# Patient Record
Sex: Female | Born: 1935
Health system: Southern US, Community
[De-identification: ages and names within clinical notes are randomized; demographics above are authoritative.]

## PROBLEM LIST (undated history)

## (undated) DIAGNOSIS — I10 Essential (primary) hypertension: Secondary | ICD-10-CM

## (undated) DIAGNOSIS — T7840XA Allergy, unspecified, initial encounter: Secondary | ICD-10-CM

## (undated) DIAGNOSIS — K219 Gastro-esophageal reflux disease without esophagitis: Secondary | ICD-10-CM

## (undated) DIAGNOSIS — E785 Hyperlipidemia, unspecified: Secondary | ICD-10-CM

## (undated) DIAGNOSIS — K5790 Diverticulosis of intestine, part unspecified, without perforation or abscess without bleeding: Secondary | ICD-10-CM

## (undated) DIAGNOSIS — I701 Atherosclerosis of renal artery: Secondary | ICD-10-CM

## (undated) DIAGNOSIS — M1990 Unspecified osteoarthritis, unspecified site: Secondary | ICD-10-CM

## (undated) DIAGNOSIS — K589 Irritable bowel syndrome without diarrhea: Secondary | ICD-10-CM

## (undated) DIAGNOSIS — K449 Diaphragmatic hernia without obstruction or gangrene: Secondary | ICD-10-CM

## (undated) DIAGNOSIS — I2510 Atherosclerotic heart disease of native coronary artery without angina pectoris: Secondary | ICD-10-CM

## (undated) DIAGNOSIS — I6529 Occlusion and stenosis of unspecified carotid artery: Secondary | ICD-10-CM

## (undated) DIAGNOSIS — F419 Anxiety disorder, unspecified: Secondary | ICD-10-CM

## (undated) HISTORY — DX: Irritable bowel syndrome, unspecified: K58.9

## (undated) HISTORY — DX: Occlusion and stenosis of unspecified carotid artery: I65.29

## (undated) HISTORY — PX: RECTAL PROLAPSE REPAIR: SHX759

## (undated) HISTORY — DX: Atherosclerosis of renal artery: I70.1

## (undated) HISTORY — DX: Atherosclerotic heart disease of native coronary artery without angina pectoris: I25.10

## (undated) HISTORY — DX: Allergy, unspecified, initial encounter: T78.40XA

## (undated) HISTORY — PX: EYE SURGERY: SHX253

## (undated) HISTORY — PX: BREAST SURGERY: SHX581

## (undated) HISTORY — DX: Hyperlipidemia, unspecified: E78.5

## (undated) HISTORY — PX: ABDOMINAL HYSTERECTOMY: SHX81

## (undated) HISTORY — DX: Essential (primary) hypertension: I10

## (undated) HISTORY — DX: Diaphragmatic hernia without obstruction or gangrene: K44.9

## (undated) HISTORY — DX: Diverticulosis of intestine, part unspecified, without perforation or abscess without bleeding: K57.90

---

## 2001-04-30 ENCOUNTER — Encounter: Payer: Self-pay | Admitting: Neurology

## 2001-04-30 ENCOUNTER — Ambulatory Visit (HOSPITAL_COMMUNITY): Admission: RE | Admit: 2001-04-30 | Discharge: 2001-04-30 | Payer: Self-pay | Admitting: Neurology

## 2001-05-07 ENCOUNTER — Ambulatory Visit (HOSPITAL_COMMUNITY): Admission: RE | Admit: 2001-05-07 | Discharge: 2001-05-07 | Payer: Self-pay | Admitting: Neurology

## 2006-02-06 ENCOUNTER — Encounter: Admission: RE | Admit: 2006-02-06 | Discharge: 2006-02-06 | Payer: Self-pay | Admitting: Internal Medicine

## 2008-06-03 ENCOUNTER — Encounter: Admission: RE | Admit: 2008-06-03 | Discharge: 2008-06-03 | Payer: Self-pay | Admitting: Gastroenterology

## 2008-07-26 ENCOUNTER — Inpatient Hospital Stay (HOSPITAL_COMMUNITY): Admission: EM | Admit: 2008-07-26 | Discharge: 2008-07-27 | Payer: Self-pay | Admitting: Emergency Medicine

## 2008-07-26 ENCOUNTER — Ambulatory Visit: Payer: Self-pay | Admitting: Cardiology

## 2008-07-27 ENCOUNTER — Encounter (INDEPENDENT_AMBULATORY_CARE_PROVIDER_SITE_OTHER): Payer: Self-pay | Admitting: Internal Medicine

## 2009-08-16 IMAGING — CT CT CERVICAL SPINE W/O CM
2 of 8 series · 4 of 27 positions shown, 5 images · non-contrast
Comparison: None

CT CERVICAL SPINE

CLINICAL DATA: Fall with neck pain and chest pain.

CT CERVICAL SPINE WITHOUT CONTRAST
CT CHEST WITHOUT CONTRAST
TECHNIQUE: Mulidetector CT imaging of the cervical spine and the
chest were performed without contrast. Multiplanar CT image
reconstructions were also generated.

[Series 6: routine chest · axial · 0.62mm/px · z∈[-582,-238]mm · 3 of 70 slices shown, 4 images]
[im 1/70  soft-tissue]
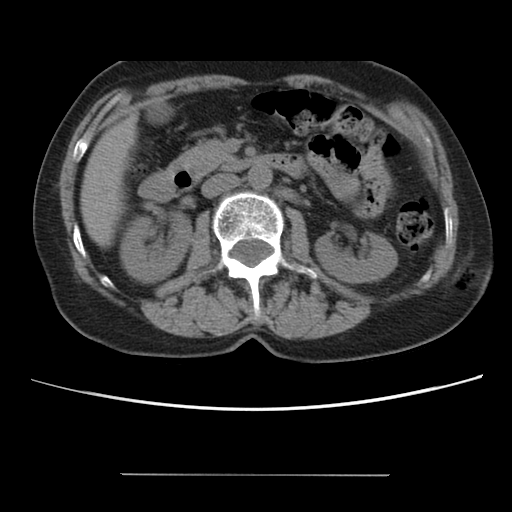
[im 1/70  bone]
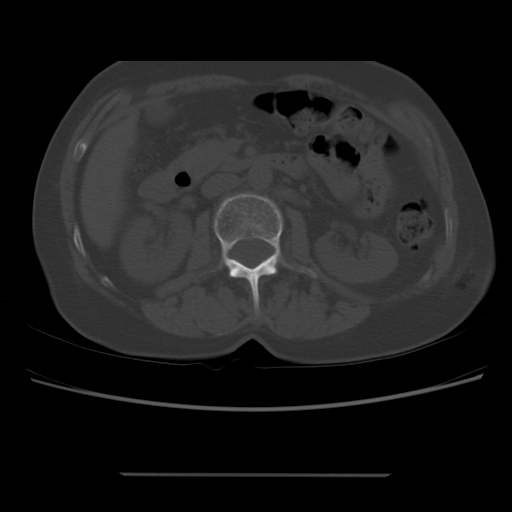
[im 35/70  bone]
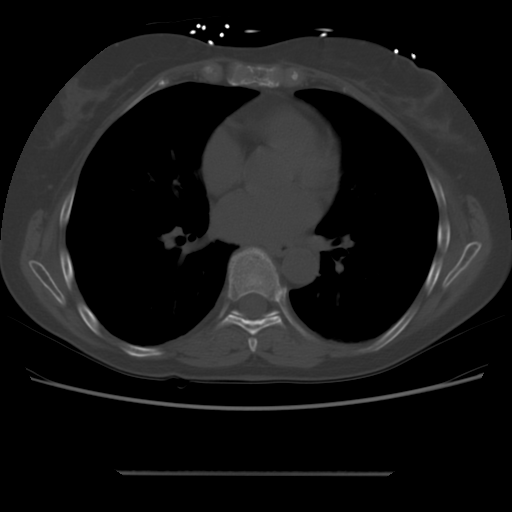
[im 70/70  bone]
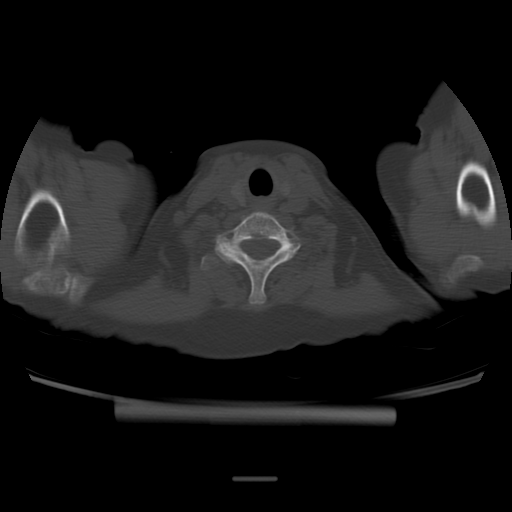

[Series 400: reformatted · sagittal · 0.37mm/px · 1 of 34 slices shown]
[im 17/34  bone]
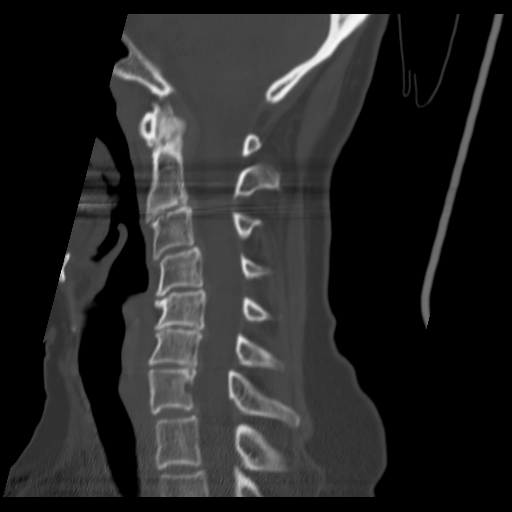

[4 of 27 positions shown; findings below may reference images not displayed]

FINDINGS: Reversal of the normal cervical lordosis is identified.

There is no evidence of acute fracture, acute subluxation,
dislocation, or prevertebral soft tissue swelling.

2 mm anterolisthesis of C3 on C4 appears degenerative.
Mild to moderate degenerative disc disease and spondylosis
contribute to mild foraminal narrowing at several levels.
IMPRESSION: No static evidence of acute injury to the cervical spine.

Mild to moderate degenerative changes of the cervical spine with
mild foraminal narrowing.
2 mm anterolisthesis of C3 on C4 - likely degenerative.

CT CHEST
FINDINGS: The heart and great vessels are within normal limits
except for minimal coronary artery atherosclerotic calcifications.
No pleural or pericardial effusions are identified.
No enlarged lymph nodes are identified.

The lungs are clear.
There is no evidence of pneumothorax.

There is a nondisplaced fracture of the manubrium with retro
sternal hematoma in the anterior mediastinum.
IMPRESSION: Nondisplaced manubrial fracture.  No evidence of pneumothorax or
pleural effusion.

## 2011-04-03 NOTE — H&P (Signed)
[name], Shirley Shea               ACCOUNT NO.:  [id number]   MEDICAL RECORD NO.:  [id number]          PATIENT TYPE:  INP   LOCATION:  1824                         FACILITY:  [facility]   PHYSICIAN:  Eduard Clos, MDDATE OF BIRTH:  09-Jul-1936   DATE OF ADMISSION:  07/26/2008  DATE OF DISCHARGE:                              HISTORY & PHYSICAL   PRIMARY CARE PHYSICIAN:  Dr. Ralene Ok.   CHIEF COMPLAINT:  Loss of consciousness.   HISTORY OF PRESENT ILLNESS:  A 75 year old female with history of  hypertension, hyperlipidemia, recently diagnosed lupus was brought into  ER by patient's husband after patient had 2 episodes of loss of  consciousness.  Patient stated she was feeling well and she took her  regular nighttime medication which included Niaspan and went to sleep.  After a few hours of sleep, she got sensation of flushing in the skin  when she woke up to take some Advil.  When she was taking the Advil her  husband heard some sound and when he went to see her she was on the  floor and had lost her consciousness for a few minutes, around 1-2  minutes.  Has not had any incontinence of urine or tongue bite.  Has not  had any seizure-like activity.  He carried her up and after 1 or 2  minutes again she had a loss of consciousness about 1-2 minutes.  Even  this time she did not have any incontinence of urine or tongue bite or  any seizure-like activity and subsequent which regained full  consciousness and she was brought to the ER.  After the incidence of  syncope, patient stated developing chest pain left-sided, constant,  nondebilitating, pressure-like, dull aching, denied any associated  shortness of breath.  Denied any weakness of limbs, denied any headache,  visual symptoms, nausea, vomiting or diarrhea, fever, chills, abdominal  pain.  Patient has been admitted for further observation.   PAST MEDICAL HISTORY:  1. Hypertension.  2. Hyperlipidemia.  3. Lupus.   PAST  SURGICAL HISTORY:  Hysterectomy.   MEDICATIONS PRIOR TO ADMISSION:  1. Lexapro 10 mg p.o. daily.  2. Niaspan.  3. Advicor.  4. Norvasc 5 mg p.o. daily.  5. B12 1000 mcg p.o. daily.  6. Calcium and vitamin D.  7. Xanax 0.5 mg p.o. at bedtime p.r.Shirley.  8. Ambien 10 p.o. at bedtime.   ALLERGIES:  CODEINE.   FAMILY HISTORY:  Significant for diabetes and breast cancer in a sister.  Patient had a breast cancer screening.   SOCIAL HISTORY:  Patient lives with her husband.  Denies smoking  cigarettes, drinking alcohol, using illegal drugs.   REVIEW OF SYSTEMS:  As per the history of presenting illness.  Nothing  else significant.   PHYSICAL EXAMINATION:  Patient examined at bedside.  Not in acute  distress.   VITAL SIGNS:  Blood pressure is 130/50.  Pulse 67 per minute.  Temperature 97.  Respiration 18 per minute.  O2 sat 99%.  HEENT:  Anicteric, no pallor.  Tongue is midline.  No facial asymmetry.  CHEST:  Bilateral air  entry present, no rhonchi, no crepitation.  HEART:  S1, S2 heard.  ABDOMEN:  Soft, nontender, bowel sounds heard.  No guarding or rigidity  seen.  CNS:  Patient is alert, awake, oriented to time, place and person.  Both  upper and lower extremities 5 by 5.  EXTREMITIES:  Peripheral pulses felt, no edema.   LABORATORIES:  EKG:  Normal sinus rhythm with no acute ST wave changes.  Chest x-ray:  Nothing acute.  Hemoglobin, hematocrit 12.2 and 36.  Basic  metabolic panel:  Sodium 140, potassium 3.9, chloride 109, glucose 101,  BUN 22, creatinine 1.4, CK-MB 1.1, troponin-I less than 0.05.   ASSESSMENT:  1. Syncope, probably vasovagal.  2. Chest pain, rule out acute coronary syndrome.  3. Hypertension.  4. Hyperlipidemia.  5. History of lupus.   PLAN:  Admit patient to telemetry.  We will cycle cardiac markers, get a  CT head without contrast, a 2-D echo and further recommendation as  patient condition evolves.      Eduard Clos, MD  Electronically  Signed     ANK/MEDQ  D:  07/26/2008  T:  07/26/2008  Job:  [id number]

## 2011-04-03 NOTE — Discharge Summary (Signed)
NAMEALYA, Shirley Shea               ACCOUNT NO.:  [id number]   MEDICAL RECORD NO.:  [id number]          PATIENT TYPE:  INP   LOCATION:  2034                         [facility]ACILITY[facility]   PHYSICIAN:  Lonia Blood, M.D.       DATE OF BIRTH:  [birth year]   DATE OF ADMISSION:  07/26/2008  DATE OF DISCHARGE:  07/27/2008                               DISCHARGE SUMMARY   THE PATIENT'S PRIMARY CARE PHYSICIAN:  Ralene Ok, MD   DISCHARGE DIAGNOSES:  1. Syncope - felt to be vasovagal.  2. Status post fall due to syncope with nondisplaced sternal fracture.  3. Severe chest pain secondary to sternal fracture.  4. Hypertension.  5. Hyperlipidemia  6. Discoid lupus.   DISCHARGE MEDICATIONS:  1. Norvasc 5 mg daily.  2. Lexapro 10 mg daily.  3. Vitamin D and calcium twice a day.  4. Tylenol 500 mg 2 tablets every 6 hours as needed for pain.  5. Ibuprofen 200 mg 2 tablets every 8 hours as needed for pain.  6. Trazodone 25 mg by mouth at bedtime as needed for sleep.  7. Aspirin 81 mg daily.  8. Quinacrine 1 tablet every other day.   CONDITION ON DISCHARGE:  Shirley Shea is discharged in good condition.  She is alert and oriented with stable vital signs without any signs of  distress.  She is instructed to follow up with Dr. Ludwig Clarks on 2-3 days.  She is instructed not to lift any heavy weights for a month.  She is  instructed to increase her activity level slowly following this sternal  fracture.  The patient was told to stop the Ambien and Advicor and also  the Actonel.  She could probably start different statin for her lipid  reduction that does not include using niacin that gave her flushing.  The patient also was told that activity can be resumed the month after  the current fracture.   PROCEDURE IN THIS ADMISSION:  1. Shirley Shea underwent head CT, which was negative for any trauma.  2. The patient underwent cervical spine CT, which was negative for      fracture.  3. The patient underwent CT  scan of the chest, which was positive for      a nondisplaced sternal fracture with small hematoma.  4. Transthoracic echocardiogram, which indicated normal ejection      fraction and no significant valvular pathology.   History and physical refer dictated H&P done by Dr. Toniann Fail.   HOSPITAL COURSE:  1. Syncope.  Shirley Shea was admitted to [facility] and placed      on telemetry.  She had three sets of cardiac enzymes, which were      all within normal limits.  Her telemetry strips did not indicate      any arrhythmias for a total of 36 hours.  The patient had a      transthoracic echocardiogram, which was completely within normal      limits.  The patient ambulate around the room without any      recurrence of her syncopal events.  We felt that the patient      probably had a vasovagal event and we discontinued her Ambien and      Advicor.  2. Status post fall due to syncope.  [name] was complaining of      some chest pain after the fall.  So for this reason, she had a CT      scan of the chest, which indicated presence of a nondisplaced      sternal fracture.  I have personally called Dr. Karle Plumber from      Thoracic Surgery who assured me that the treatment for this      fracture is analgesics and it will heal with time.  The patient was      told not to do any heavy lifting or heavy exercising as to displace      the fracture and she will use Tylenol and ibuprofen as needed for      her pain.      Lonia Blood, M.D.  Electronically Signed     SL/MEDQ  D:  07/27/2008  T:  07/28/2008  Job:  [id number]   cc:   Ralene Ok, M.D.

## 2011-07-19 ENCOUNTER — Ambulatory Visit (HOSPITAL_COMMUNITY)
Admission: RE | Admit: 2011-07-19 | Discharge: 2011-07-19 | Disposition: A | Discharge status: Home / Self Care | Payer: Medicare Other | Source: Ambulatory Visit | Attending: Gastroenterology | Admitting: Gastroenterology

## 2011-07-19 DIAGNOSIS — Z7982 Long term (current) use of aspirin: Secondary | ICD-10-CM | POA: Insufficient documentation

## 2011-07-19 DIAGNOSIS — Z79899 Other long term (current) drug therapy: Secondary | ICD-10-CM | POA: Insufficient documentation

## 2011-07-19 DIAGNOSIS — R1310 Dysphagia, unspecified: Secondary | ICD-10-CM | POA: Insufficient documentation

## 2011-07-19 DIAGNOSIS — E7800 Pure hypercholesterolemia, unspecified: Secondary | ICD-10-CM | POA: Insufficient documentation

## 2011-07-19 DIAGNOSIS — M810 Age-related osteoporosis without current pathological fracture: Secondary | ICD-10-CM | POA: Insufficient documentation

## 2011-07-29 NOTE — Op Note (Signed)
  NAMENELIDA, MANDARINO NO.:  [id number]  MEDICAL RECORD NO.:  [id number]  LOCATION:  WLEN                         FACILITY:  [facility]  PHYSICIAN:  Danise Edge, M.D.   DATE OF BIRTH:  13-Feb-1936  DATE OF PROCEDURE:  07/19/2011 DATE OF DISCHARGE:                              OPERATIVE REPORT   REFERRING PHYSICIAN:  Ralene Ok, MD  PROCEDURE:  Diagnostic esophagogastroduodenoscopy.  HISTORY:  Ms. Shirley Shea is a 75 year old female born on Oct 16, 1936.  The patient has developed cervical esophageal odynophagia and mild dysphagia.  In 2002, her esophagogastroduodenoscopy was normal.  In 2002 and in 2011, her screening colonoscopies were normal.  The patient chronically takes Nexium, Ambien, multivitamin, Azor, Plaquenil, estradiol, Niaspan, aspirin, simvastatin, alprazolam, alendronate.  ENDOSCOPIST:  Danise Edge, MD  PREMEDICATION:  Fentanyl 75 mcg, Versed 10 mg.  PROCEDURE IN DETAIL:  The patient was placed in the left lateral decubitus position.  The Pentax gastroscope was passed through the posterior hypopharynx and into the proximal esophagus without difficulty.  The hypopharynx, larynx, and vocal cords appeared normal.  Esophagoscopy:  The proximal mid and lower segments of the esophageal mucosa appear completely normal.  The squamocolumnar junction was noted at 40 cm from the incisor teeth.  There was no endoscopic evidence for the presence of erosive esophagitis, Barrett esophagus, or esophageal stricture formation.  Gastroscopy:  Retroflex view of the gastric cardia and fundus was normal.  The gastric body, antrum, and pylorus appeared normal.  Duodenoscopy:  The duodenal bulb and descending duodenum appeared normal.  ASSESSMENT:  Normal esophagogastroduodenoscopy.          ______________________________ Danise Edge, M.D.     MJ/MEDQ  D:  07/19/2011  T:  07/20/2011  Job:  [id number]  cc:   Ralene Ok, M.D. Fax:  [phone]  Electronically Signed by Danise Edge M.D. on 07/29/2011 09:13:37 AM

## 2011-08-22 LAB — CARDIAC PANEL(CRET KIN+CKTOT+MB+TROPI)
CK, MB: 1.6
CK, MB: 2.5
Total CK: 168

## 2011-08-22 LAB — BASIC METABOLIC PANEL
Calcium: 8.9
GFR calc non Af Amer: 55 — ABNORMAL LOW
Glucose, Bld: 92
Sodium: 143

## 2011-08-22 LAB — POCT I-STAT, CHEM 8
HCT: 36
Hemoglobin: 12.2
Sodium: 140
TCO2: 23

## 2011-08-22 LAB — CBC
Hemoglobin: 11.6 — ABNORMAL LOW
Platelets: 200
RDW: 13.3

## 2011-08-22 LAB — LIPID PANEL
Cholesterol: 175
HDL: 67
LDL Cholesterol: 98
Total CHOL/HDL Ratio: 2.6

## 2011-08-22 LAB — POCT CARDIAC MARKERS
CKMB, poc: 1.1
Myoglobin, poc: 218

## 2012-09-11 ENCOUNTER — Other Ambulatory Visit: Payer: Self-pay

## 2012-09-11 DIAGNOSIS — I6529 Occlusion and stenosis of unspecified carotid artery: Secondary | ICD-10-CM

## 2012-09-15 ENCOUNTER — Encounter: Payer: Self-pay | Admitting: Surgery

## 2012-09-19 ENCOUNTER — Encounter: Payer: Self-pay | Admitting: Surgery

## 2012-09-22 ENCOUNTER — Ambulatory Visit (INDEPENDENT_AMBULATORY_CARE_PROVIDER_SITE_OTHER): Payer: Medicare Other | Admitting: Vascular Surgery

## 2012-09-22 ENCOUNTER — Ambulatory Visit (INDEPENDENT_AMBULATORY_CARE_PROVIDER_SITE_OTHER): Payer: Medicare Other | Admitting: Surgery

## 2012-09-22 ENCOUNTER — Encounter: Payer: Self-pay | Admitting: Surgery

## 2012-09-22 VITALS — BP 175/60 | HR 55 | Ht 65.0 in | Wt 124.0 lb

## 2012-09-22 DIAGNOSIS — I6529 Occlusion and stenosis of unspecified carotid artery: Secondary | ICD-10-CM

## 2012-09-22 NOTE — Progress Notes (Signed)
[facility] [place]   Patient name: Shirley Shea MRN: [id number] DOB: 05/31/1936 Sex: female   Referred by: Dr. Veranasi  Reason for referral:  Chief Complaint  Patient presents with  . New Evaluation    carotid stenosis/ asymptomatic Dr. Varanasi    HISTORY OF PRESENT ILLNESS: The patient comes in today for evaluation of bilateral carotid stenosis. She has been followed by Dr. Veranasi for renal artery issues in the setting of hypertension and lupus. He auscultated a bilateral carotid bruit and proceeded with duplex ultrasound which revealed high-grade bilateral carotid stenosis. Therefore, the patient was sent over for discussions of carotid endarterectomy. The patient denies symptoms. Specifically, she denies numbness or weakness in either extremity. She denies slurred speech. She denies amaurosis fugax. She has had a couple episodes of losing her balance. She does not endorse dizziness.  The patient is medically managed for hypertension and hypercholesterolemia. She is also being treated for lupus. She is a nonsmoker and has never smoked.  Past Medical History  Diagnosis Date  . Diverticulosis   . Depression   . Hyperlipidemia   . IBS (irritable bowel syndrome)   . Allergy   . Hiatal hernia   . Carotid artery occlusion   . Hypertension   . Renal artery stenosis   . CAD (coronary artery disease)     Past Surgical History  Procedure Date  . Abdominal hysterectomy   . Rectal prolapse repair     [facility]. Dr. Greg Waters    History   Social History  . Marital Status: Divorced    Spouse Name: N/A    Number of Children: N/A  . Years of Education: N/A   Occupational History  . Not on file.   Social History Main Topics  . Smoking status: Never Smoker   . Smokeless tobacco: Never Used  . Alcohol Use: 1.0 oz/week    2 drink(s) per week  . Drug Use: No  . Sexually Active: Not on file   Other Topics Concern  . Not on file   Social  History Narrative  . No narrative on file    Family History  Problem Relation Age of Onset  . Hyperlipidemia Mother   . Hypertension Mother   . Diabetes Father     Allergies as of 09/22/2012 - Review Complete 09/22/2012  Allergen Reaction Noted  . Ace inhibitors  09/15/2012  . Codeine  09/15/2012    Current Outpatient Prescriptions on File Prior to Visit  Medication Sig Dispense Refill  . alendronate (FOSAMAX) 40 MG tablet Take 70 mg by mouth every 7 (seven) days. Take with a full glass of water on an empty stomach.      . ALPRAZolam (XANAX) 0.5 MG tablet Take 0.5 mg by mouth at bedtime as needed.      . aspirin 81 MG tablet Take 81 mg by mouth daily.      . Calcium Carb-Cholecalciferol (CALCIUM + D3) 600-200 MG-UNIT TABS Take 600 mg by mouth daily.      . cycloSPORINE (RESTASIS) 0.05 % ophthalmic emulsion 1 drop at bedtime. Emulsion 1 into affected eye      . esomeprazole (NEXIUM) 40 MG capsule Take 40 mg by mouth as needed.      . estradiol (VIVELLE-DOT) 0.025 MG/24HR Place 1 patch onto the skin 2 (two) times a week.      . hydroxychloroquine (PLAQUENIL) 200 MG tablet Take 200 mg by mouth daily.      .   metoprolol tartrate (LOPRESSOR) 25 MG tablet Take 25 mg by mouth 2 (two) times daily.      . niacin (NIASPAN) 500 MG CR tablet Take 500 mg by mouth at bedtime.      . simvastatin (ZOCOR) 20 MG tablet Take 20 mg by mouth every evening.      . zolpidem (AMBIEN) 10 MG tablet Take 5 mg by mouth at bedtime as needed.          REVIEW OF SYSTEMS: Cardiovascular: Positive for chest pressure, otherwise no chest pain, palpitations, orthopnea, or dyspnea on exertion. No claudication or rest pain,  No history of DVT or phlebitis. Pulmonary: No productive cough, asthma or wheezing. Neurologic: No weakness, paresthesias, aphasia, or amaurosis. No dizziness. Hematologic: No bleeding problems or clotting disorders. Musculoskeletal: No joint pain or joint swelling. Gastrointestinal: No blood  in stool or hematemesis Genitourinary: No dysuria or hematuria. Psychiatric:: No history of major depression. Integumentary: No rashes or ulcers. Constitutional: No fever or chills.  PHYSICAL EXAMINATION: General: The patient appears their stated age.  Vital signs are BP 175/60  Pulse 55  Ht 5' 5" (1.651 m)  Wt 124 lb (56.246 kg)  BMI 20.63 kg/m2  SpO2 100% HEENT:  No gross abnormalities Pulmonary: Respirations are non-labored Musculoskeletal: There are no major deformities.   Neurologic: No focal weakness or paresthesias are detected, Skin: There are no ulcer or rashes noted. Psychiatric: The patient has normal affect. Cardiovascular: There is a regular rate and rhythm without significant murmur appreciated. Bilateral carotid bruits  Diagnostic Studies: I have reviewed her outside ultrasound as well as an ultrasound performed here which shows bilateral greater than 80% carotid stenosis. The left side is 619/239, and the right side is 468/148 bifurcation is the mid hyaloid. The artery is normal past the stenosis.  Outside Studies/Documentation Historical records were reviewed.  They showed high lateral high-grade carotid stenosis, asymptomatic   Assessment:  Asymptomatic bilateral carotid stenosis, left greater than right Plan: I discussed our treatment options with the patient including endarterectomy versus stenting. Because she is low risk, asymptomatic, she is not a candidate for carotid stenting and therefore I have recommended carotid endarterectomy. We discussed the risks and benefits of the operation, including the risk of stroke, the risk of nerve injury, the risk of bleeding, the risk of cardiopulmonary complications, and the risk of infection. All of her questions were answered. I have scheduled her operation for Thursday, November 21. She will need staged bilateral carotid endarterectomy. Since the left side is the more severe side by ultrasound I would begin with a left  carotid endarterectomy. I am not getting any additional testing given her bilateral carotid stenosis, do to her renal disease.     V. Wells Odilia Damico IV, M.D. [facility] [place] Office: [phone] Pager:  [phone]   

## 2012-09-22 NOTE — Progress Notes (Signed)
Limited carotid duplex performed @ [facility] 09/22/2012

## 2012-09-26 ENCOUNTER — Encounter (HOSPITAL_COMMUNITY): Payer: Self-pay | Admitting: Pharmacy Technician

## 2012-09-29 ENCOUNTER — Other Ambulatory Visit: Payer: Self-pay

## 2012-09-30 NOTE — Pre-Procedure Instructions (Signed)
20 Shirley Shea  09/30/2012   Your procedure is scheduled on: Thursday, November 21st   Report to [facility] at  5:30 AM.  Call this number if you have problems the morning of surgery: [phone]   Remember:   Do not eat food or drink any liquids:After Midnight Wednesday.    Take these medicines the morning of surgery with A SIP OF WATER: Xanax, Lexapro, Nexium,              Metoprolol   Do not wear jewelry, make-up or nail polish.  Do not wear lotions, powders, or perfumes. You may wear NOT deodorant.  Ladies---Do not shave 48 hours prior to surgery.    Do not bring valuables to the hospital.  Contacts, dentures or bridgework may not be worn into surgery.   Leave suitcase in the car. After surgery it may be brought to your room.  For patients admitted to the hospital, checkout time is 11:00 AM the day of discharge.   Patients discharged the day of surgery will not be allowed to drive home.   Name and phone number of your driver:   Special Instructions: Shower using CHG 2 nights before surgery and the night before surgery.  If you shower the day of surgery use CHG.  Use special wash - you have one bottle of CHG for all showers.  You should use approximately 1/3 of the bottle for each shower.   Please read over the following fact sheets that you were given: Pain Booklet, Blood Transfusion Information, MRSA Information and Surgical Site Infection Prevention

## 2012-10-01 ENCOUNTER — Encounter (HOSPITAL_COMMUNITY)
Admission: RE | Admit: 2012-10-01 | Discharge: 2012-10-01 | Disposition: A | Discharge status: Home / Self Care | Payer: Medicare Other | Source: Ambulatory Visit | Attending: Surgery | Admitting: Surgery

## 2012-10-01 ENCOUNTER — Encounter (HOSPITAL_COMMUNITY): Payer: Self-pay

## 2012-10-01 ENCOUNTER — Encounter (HOSPITAL_COMMUNITY)
Admission: RE | Admit: 2012-10-01 | Discharge: 2012-10-01 | Disposition: A | Discharge status: Home / Self Care | Payer: Medicare Other | Source: Ambulatory Visit | Attending: Anesthesiology | Admitting: Anesthesiology

## 2012-10-01 HISTORY — DX: Unspecified osteoarthritis, unspecified site: M19.90

## 2012-10-01 HISTORY — DX: Anxiety disorder, unspecified: F41.9

## 2012-10-01 LAB — COMPREHENSIVE METABOLIC PANEL
ALT: 23 U/L (ref 0–35)
Alkaline Phosphatase: 53 U/L (ref 39–117)
BUN: 13 mg/dL (ref 6–23)
Chloride: 103 mEq/L (ref 96–112)
GFR calc Af Amer: 62 mL/min — ABNORMAL LOW (ref >90)
Glucose, Bld: 85 mg/dL (ref 70–99)
Potassium: 3.6 mEq/L (ref 3.5–5.1)
Sodium: 139 mEq/L (ref 135–145)
Total Bilirubin: 0.3 mg/dL (ref 0.3–1.2)

## 2012-10-01 LAB — URINALYSIS, ROUTINE W REFLEX MICROSCOPIC
Glucose, UA: NEGATIVE mg/dL
Hgb urine dipstick: NEGATIVE
Ketones, ur: NEGATIVE mg/dL
Protein, ur: NEGATIVE mg/dL
pH: 6.5 (ref 5.0–8.0)

## 2012-10-01 LAB — CBC
HCT: 38 % (ref 36.0–46.0)
Hemoglobin: 12.7 g/dL (ref 12.0–15.0)
RBC: 4.24 MIL/uL (ref 3.87–5.11)
WBC: 7.6 10*3/uL (ref 4.0–10.5)

## 2012-10-01 LAB — URINE MICROSCOPIC-ADD ON

## 2012-10-01 LAB — TYPE AND SCREEN: ABO/RH(D): A NEG

## 2012-10-01 LAB — SURGICAL PCR SCREEN: Staphylococcus aureus: NEGATIVE

## 2012-10-01 LAB — APTT: aPTT: 27 seconds (ref 24–37)

## 2012-10-01 LAB — ABO/RH: ABO/RH(D): A NEG

## 2012-10-08 MED ORDER — DEXTROSE 5 % IV SOLN
1.5000 g | INTRAVENOUS | Status: AC
  Administered 2012-10-09: 1.5 g via INTRAVENOUS
  Filled 2012-10-08: qty 1.5

## 2012-10-08 NOTE — Progress Notes (Signed)
Notified patient of time change, instructed to arrive at 730 am .

## 2012-10-09 ENCOUNTER — Inpatient Hospital Stay (HOSPITAL_COMMUNITY): Payer: Medicare Other | Admitting: Anesthesiology

## 2012-10-09 ENCOUNTER — Encounter (HOSPITAL_COMMUNITY): Payer: Self-pay | Admitting: *Deleted

## 2012-10-09 ENCOUNTER — Inpatient Hospital Stay (HOSPITAL_COMMUNITY)
Admission: RE | Admit: 2012-10-09 | Discharge: 2012-10-10 | DRG: 039 | Disposition: A | Discharge status: Home / Self Care | Payer: Medicare Other | Source: Ambulatory Visit | Attending: Surgery | Admitting: Surgery

## 2012-10-09 ENCOUNTER — Telehealth: Payer: Self-pay | Admitting: Surgery

## 2012-10-09 ENCOUNTER — Encounter (HOSPITAL_COMMUNITY)
Admission: RE | Disposition: A | Discharge status: Home / Self Care | Payer: Self-pay | Source: Ambulatory Visit | Attending: Surgery

## 2012-10-09 ENCOUNTER — Encounter (HOSPITAL_COMMUNITY): Payer: Self-pay | Admitting: Anesthesiology

## 2012-10-09 ENCOUNTER — Inpatient Hospital Stay (HOSPITAL_COMMUNITY): Payer: Medicare Other

## 2012-10-09 DIAGNOSIS — Z79899 Other long term (current) drug therapy: Secondary | ICD-10-CM

## 2012-10-09 DIAGNOSIS — M329 Systemic lupus erythematosus, unspecified: Secondary | ICD-10-CM | POA: Diagnosis present

## 2012-10-09 DIAGNOSIS — Z8719 Personal history of other diseases of the digestive system: Secondary | ICD-10-CM

## 2012-10-09 DIAGNOSIS — M25519 Pain in unspecified shoulder: Secondary | ICD-10-CM | POA: Diagnosis not present

## 2012-10-09 DIAGNOSIS — I6529 Occlusion and stenosis of unspecified carotid artery: Secondary | ICD-10-CM

## 2012-10-09 DIAGNOSIS — Z90710 Acquired absence of both cervix and uterus: Secondary | ICD-10-CM

## 2012-10-09 DIAGNOSIS — K449 Diaphragmatic hernia without obstruction or gangrene: Secondary | ICD-10-CM | POA: Diagnosis present

## 2012-10-09 DIAGNOSIS — M19049 Primary osteoarthritis, unspecified hand: Secondary | ICD-10-CM | POA: Diagnosis present

## 2012-10-09 DIAGNOSIS — I2510 Atherosclerotic heart disease of native coronary artery without angina pectoris: Secondary | ICD-10-CM | POA: Diagnosis present

## 2012-10-09 DIAGNOSIS — I10 Essential (primary) hypertension: Secondary | ICD-10-CM | POA: Diagnosis present

## 2012-10-09 DIAGNOSIS — F411 Generalized anxiety disorder: Secondary | ICD-10-CM | POA: Diagnosis present

## 2012-10-09 DIAGNOSIS — K219 Gastro-esophageal reflux disease without esophagitis: Secondary | ICD-10-CM | POA: Diagnosis present

## 2012-10-09 DIAGNOSIS — I658 Occlusion and stenosis of other precerebral arteries: Secondary | ICD-10-CM | POA: Diagnosis present

## 2012-10-09 DIAGNOSIS — I739 Peripheral vascular disease, unspecified: Secondary | ICD-10-CM | POA: Diagnosis present

## 2012-10-09 DIAGNOSIS — Z888 Allergy status to other drugs, medicaments and biological substances status: Secondary | ICD-10-CM

## 2012-10-09 DIAGNOSIS — N289 Disorder of kidney and ureter, unspecified: Secondary | ICD-10-CM | POA: Diagnosis present

## 2012-10-09 DIAGNOSIS — Z885 Allergy status to narcotic agent status: Secondary | ICD-10-CM

## 2012-10-09 DIAGNOSIS — Z7982 Long term (current) use of aspirin: Secondary | ICD-10-CM

## 2012-10-09 DIAGNOSIS — E785 Hyperlipidemia, unspecified: Secondary | ICD-10-CM | POA: Diagnosis present

## 2012-10-09 HISTORY — PX: ENDARTERECTOMY: SHX5162

## 2012-10-09 HISTORY — PX: PATCH ANGIOPLASTY: SHX6230

## 2012-10-09 SURGERY — ENDARTERECTOMY, CAROTID
Anesthesia: General | Site: Neck | Laterality: Left | Wound class: Clean

## 2012-10-09 MED ORDER — TRAMADOL HCL 50 MG PO TABS: 50.0000 mg | ORAL_TABLET | Freq: Four times a day (QID) | ORAL | Status: DC | PRN

## 2012-10-09 MED ORDER — ESTRADIOL 0.05 MG/24HR TD PTWK
0.0500 mg | MEDICATED_PATCH | TRANSDERMAL | Status: DC
  Filled 2012-10-09: qty 1

## 2012-10-09 MED ORDER — SODIUM CHLORIDE 0.9 % IV SOLN
INTRAVENOUS | Status: DC
  Administered 2012-10-09: 16:00:00 via INTRAVENOUS

## 2012-10-09 MED ORDER — SODIUM CHLORIDE 0.9 % IV SOLN
500.0000 mL | Freq: Once | INTRAVENOUS | Status: AC | PRN
  Administered 2012-10-09 (×2): 500 mL via INTRAVENOUS

## 2012-10-09 MED ORDER — ZOLPIDEM TARTRATE 5 MG PO TABS: 5.0000 mg | ORAL_TABLET | Freq: Every evening | ORAL | Status: DC | PRN

## 2012-10-09 MED ORDER — MIDAZOLAM HCL 2 MG/2ML IJ SOLN: 0.5000 mg | Freq: Once | INTRAMUSCULAR | Status: DC | PRN

## 2012-10-09 MED ORDER — DIPHENHYDRAMINE HCL 25 MG PO CAPS: 25.0000 mg | ORAL_CAPSULE | Freq: Four times a day (QID) | ORAL | Status: DC | PRN

## 2012-10-09 MED ORDER — ALPRAZOLAM 0.5 MG PO TABS
0.5000 mg | ORAL_TABLET | Freq: Every day | ORAL | Status: DC
  Administered 2012-10-09: 0.5 mg via ORAL
  Filled 2012-10-09: qty 1

## 2012-10-09 MED ORDER — METOPROLOL TARTRATE 1 MG/ML IV SOLN: 2.0000 mg | INTRAVENOUS | Status: DC | PRN

## 2012-10-09 MED ORDER — POTASSIUM CHLORIDE CRYS ER 20 MEQ PO TBCR: 20.0000 meq | EXTENDED_RELEASE_TABLET | Freq: Once | ORAL | Status: AC | PRN

## 2012-10-09 MED ORDER — DOPAMINE-DEXTROSE 3.2-5 MG/ML-% IV SOLN: 3.0000 ug/kg/min | INTRAVENOUS | Status: DC

## 2012-10-09 MED ORDER — PHENOL 1.4 % MT LIQD: 1.0000 | OROMUCOSAL | Status: DC | PRN

## 2012-10-09 MED ORDER — PANTOPRAZOLE SODIUM 40 MG PO TBEC
40.0000 mg | DELAYED_RELEASE_TABLET | Freq: Every day | ORAL | Status: DC
  Administered 2012-10-09: 40 mg via ORAL
  Filled 2012-10-09: qty 1

## 2012-10-09 MED ORDER — ESCITALOPRAM OXALATE 10 MG PO TABS
10.0000 mg | ORAL_TABLET | Freq: Every day | ORAL | Status: DC
  Administered 2012-10-09: 10 mg via ORAL
  Filled 2012-10-09 (×2): qty 1

## 2012-10-09 MED ORDER — NIACIN ER (ANTIHYPERLIPIDEMIC) 500 MG PO TBCR
500.0000 mg | EXTENDED_RELEASE_TABLET | Freq: Every day | ORAL | Status: DC
  Administered 2012-10-09: 500 mg via ORAL
  Filled 2012-10-09 (×2): qty 1

## 2012-10-09 MED ORDER — PROPOFOL 10 MG/ML IV BOLUS
INTRAVENOUS | Status: DC | PRN
  Administered 2012-10-09: 100 mg via INTRAVENOUS
  Administered 2012-10-09: 30 mg via INTRAVENOUS
  Administered 2012-10-09: 20 mg via INTRAVENOUS

## 2012-10-09 MED ORDER — ROCURONIUM BROMIDE 100 MG/10ML IV SOLN
INTRAVENOUS | Status: DC | PRN
  Administered 2012-10-09: 50 mg via INTRAVENOUS

## 2012-10-09 MED ORDER — MAGNESIUM SULFATE 40 MG/ML IJ SOLN
2.0000 g | Freq: Once | INTRAMUSCULAR | Status: AC | PRN
  Filled 2012-10-09: qty 50

## 2012-10-09 MED ORDER — ONDANSETRON HCL 4 MG/2ML IJ SOLN
INTRAMUSCULAR | Status: DC | PRN
  Administered 2012-10-09: 4 mg via INTRAVENOUS

## 2012-10-09 MED ORDER — DOPAMINE-DEXTROSE 3.2-5 MG/ML-% IV SOLN
3.0000 ug/kg/min | INTRAVENOUS | Status: DC
  Administered 2012-10-09: 3 ug/kg/min via INTRAVENOUS

## 2012-10-09 MED ORDER — FENTANYL CITRATE 0.05 MG/ML IJ SOLN: 25.0000 ug | INTRAMUSCULAR | Status: DC | PRN

## 2012-10-09 MED ORDER — CYCLOSPORINE 0.05 % OP EMUL
1.0000 [drp] | Freq: Every day | OPHTHALMIC | Status: DC
  Administered 2012-10-09: 1 [drp] via OPHTHALMIC
  Filled 2012-10-09 (×3): qty 1

## 2012-10-09 MED ORDER — ALUM & MAG HYDROXIDE-SIMETH 200-200-20 MG/5ML PO SUSP: 15.0000 mL | ORAL | Status: DC | PRN

## 2012-10-09 MED ORDER — PROTAMINE SULFATE 10 MG/ML IV SOLN
INTRAVENOUS | Status: DC | PRN
  Administered 2012-10-09 (×3): 10 mg via INTRAVENOUS
  Administered 2012-10-09: 20 mg via INTRAVENOUS

## 2012-10-09 MED ORDER — FENTANYL CITRATE 0.05 MG/ML IJ SOLN
INTRAMUSCULAR | Status: DC | PRN
  Administered 2012-10-09: 250 ug via INTRAVENOUS

## 2012-10-09 MED ORDER — HYDRALAZINE HCL 20 MG/ML IJ SOLN: 10.0000 mg | INTRAMUSCULAR | Status: DC | PRN

## 2012-10-09 MED ORDER — ARTIFICIAL TEARS OP OINT
TOPICAL_OINTMENT | OPHTHALMIC | Status: DC | PRN
  Administered 2012-10-09: 1 via OPHTHALMIC

## 2012-10-09 MED ORDER — CALCIUM CARBONATE-VITAMIN D 500-200 MG-UNIT PO TABS
1.0000 | ORAL_TABLET | Freq: Every day | ORAL | Status: DC
  Administered 2012-10-09: 1 via ORAL
  Filled 2012-10-09 (×2): qty 1

## 2012-10-09 MED ORDER — DEXAMETHASONE SODIUM PHOSPHATE 4 MG/ML IJ SOLN
INTRAMUSCULAR | Status: DC | PRN
  Administered 2012-10-09: 8 mg via INTRAVENOUS

## 2012-10-09 MED ORDER — GLYCOPYRROLATE 0.2 MG/ML IJ SOLN
INTRAMUSCULAR | Status: DC | PRN
  Administered 2012-10-09: 0.2 mg via INTRAVENOUS
  Administered 2012-10-09: .8 mg via INTRAVENOUS

## 2012-10-09 MED ORDER — SODIUM CHLORIDE 0.9 % IR SOLN
Status: DC | PRN
  Administered 2012-10-09: 13:00:00

## 2012-10-09 MED ORDER — MORPHINE SULFATE 2 MG/ML IJ SOLN
2.0000 mg | INTRAMUSCULAR | Status: DC | PRN
  Administered 2012-10-09: 2 mg via INTRAVENOUS
  Filled 2012-10-09: qty 1

## 2012-10-09 MED ORDER — SODIUM CHLORIDE 0.9 % IV SOLN: INTRAVENOUS | Status: DC

## 2012-10-09 MED ORDER — LIDOCAINE HCL 4 % MT SOLN
OROMUCOSAL | Status: DC | PRN
  Administered 2012-10-09: 4 mL via TOPICAL

## 2012-10-09 MED ORDER — ACETAMINOPHEN 650 MG RE SUPP: 325.0000 mg | RECTAL | Status: DC | PRN

## 2012-10-09 MED ORDER — LIDOCAINE HCL (CARDIAC) 20 MG/ML IV SOLN
INTRAVENOUS | Status: DC | PRN
  Administered 2012-10-09: 30 mg via INTRAVENOUS

## 2012-10-09 MED ORDER — HYDROXYCHLOROQUINE SULFATE 200 MG PO TABS
200.0000 mg | ORAL_TABLET | Freq: Every day | ORAL | Status: DC
  Administered 2012-10-09: 200 mg via ORAL
  Filled 2012-10-09 (×2): qty 1

## 2012-10-09 MED ORDER — CALCIUM + D3 600-200 MG-UNIT PO TABS: 600.0000 mg | ORAL_TABLET | Freq: Every day | ORAL | Status: DC

## 2012-10-09 MED ORDER — HEPARIN SODIUM (PORCINE) 1000 UNIT/ML IJ SOLN
INTRAMUSCULAR | Status: DC | PRN
  Administered 2012-10-09: 6000 [IU] via INTRAVENOUS

## 2012-10-09 MED ORDER — PHENYLEPHRINE HCL 10 MG/ML IJ SOLN
10.0000 mg | INTRAVENOUS | Status: DC | PRN
  Administered 2012-10-09: 10 ug/min via INTRAVENOUS

## 2012-10-09 MED ORDER — DOCUSATE SODIUM 100 MG PO CAPS: 100.0000 mg | ORAL_CAPSULE | Freq: Every day | ORAL | Status: DC

## 2012-10-09 MED ORDER — GUAIFENESIN-DM 100-10 MG/5ML PO SYRP: 15.0000 mL | ORAL_SOLUTION | ORAL | Status: DC | PRN

## 2012-10-09 MED ORDER — ASPIRIN 81 MG PO CHEW
81.0000 mg | CHEWABLE_TABLET | Freq: Every day | ORAL | Status: DC
  Administered 2012-10-09: 81 mg via ORAL
  Filled 2012-10-09 (×2): qty 1

## 2012-10-09 MED ORDER — LACTATED RINGERS IV SOLN
INTRAVENOUS | Status: DC | PRN
  Administered 2012-10-09 (×2): via INTRAVENOUS

## 2012-10-09 MED ORDER — PROMETHAZINE HCL 25 MG/ML IJ SOLN: 6.2500 mg | INTRAMUSCULAR | Status: DC | PRN

## 2012-10-09 MED ORDER — DOPAMINE-DEXTROSE 3.2-5 MG/ML-% IV SOLN
INTRAVENOUS | Status: AC
  Filled 2012-10-09: qty 250

## 2012-10-09 MED ORDER — ONDANSETRON HCL 4 MG/2ML IJ SOLN: 4.0000 mg | Freq: Four times a day (QID) | INTRAMUSCULAR | Status: DC | PRN

## 2012-10-09 MED ORDER — SIMVASTATIN 20 MG PO TABS
20.0000 mg | ORAL_TABLET | Freq: Every evening | ORAL | Status: DC
  Administered 2012-10-09: 20 mg via ORAL
  Filled 2012-10-09 (×3): qty 1

## 2012-10-09 MED ORDER — 0.9 % SODIUM CHLORIDE (POUR BTL) OPTIME
TOPICAL | Status: DC | PRN
  Administered 2012-10-09: 2000 mL

## 2012-10-09 MED ORDER — OXYCODONE HCL 5 MG PO TABS: 5.0000 mg | ORAL_TABLET | Freq: Once | ORAL | Status: DC | PRN

## 2012-10-09 MED ORDER — NEOSTIGMINE METHYLSULFATE 1 MG/ML IJ SOLN
INTRAMUSCULAR | Status: DC | PRN
  Administered 2012-10-09: 5 mg via INTRAVENOUS

## 2012-10-09 MED ORDER — OXYCODONE HCL 5 MG/5ML PO SOLN: 5.0000 mg | Freq: Once | ORAL | Status: DC | PRN

## 2012-10-09 MED ORDER — LACTATED RINGERS IV SOLN
INTRAVENOUS | Status: DC
  Administered 2012-10-09: 11:00:00 via INTRAVENOUS

## 2012-10-09 MED ORDER — ACETAMINOPHEN 325 MG PO TABS: 325.0000 mg | ORAL_TABLET | ORAL | Status: DC | PRN

## 2012-10-09 MED ORDER — OXYCODONE HCL 5 MG PO TABS
5.0000 mg | ORAL_TABLET | ORAL | Status: DC | PRN
Start: 2012-10-09 — End: 2012-10-10
  Administered 2012-10-10: 5 mg via ORAL
  Filled 2012-10-09 (×2): qty 1

## 2012-10-09 MED ORDER — MEPERIDINE HCL 25 MG/ML IJ SOLN: 6.2500 mg | INTRAMUSCULAR | Status: DC | PRN

## 2012-10-09 MED ORDER — DEXTROSE 5 % IV SOLN
1.5000 g | Freq: Two times a day (BID) | INTRAVENOUS | Status: AC
  Administered 2012-10-09 – 2012-10-10 (×2): 1.5 g via INTRAVENOUS
  Filled 2012-10-09 (×3): qty 1.5

## 2012-10-09 MED ORDER — EPHEDRINE SULFATE 50 MG/ML IJ SOLN
INTRAMUSCULAR | Status: DC | PRN
  Administered 2012-10-09: 5 mg via INTRAVENOUS

## 2012-10-09 MED ORDER — LABETALOL HCL 5 MG/ML IV SOLN: 10.0000 mg | INTRAVENOUS | Status: DC | PRN

## 2012-10-09 MED ORDER — METOPROLOL TARTRATE 25 MG PO TABS
25.0000 mg | ORAL_TABLET | Freq: Two times a day (BID) | ORAL | Status: DC
  Filled 2012-10-09 (×3): qty 1

## 2012-10-09 SURGICAL SUPPLY — 49 items
CANISTER SUCTION 2500CC (MISCELLANEOUS) ×2 IMPLANT
CATH ROBINSON RED A/P 18FR (CATHETERS) ×2 IMPLANT
CATH SUCT 10FR WHISTLE TIP (CATHETERS) ×2 IMPLANT
CLIP TI MEDIUM 24 (CLIP) ×2 IMPLANT
CLIP TI WIDE RED SMALL 24 (CLIP) ×2 IMPLANT
CLOTH BEACON ORANGE TIMEOUT ST (SAFETY) ×2 IMPLANT
COVER SURGICAL LIGHT HANDLE (MISCELLANEOUS) ×2 IMPLANT
CRADLE DONUT ADULT HEAD (MISCELLANEOUS) ×2 IMPLANT
DERMABOND ADHESIVE PROPEN (GAUZE/BANDAGES/DRESSINGS) ×1
DERMABOND ADVANCED (GAUZE/BANDAGES/DRESSINGS) ×1
DERMABOND ADVANCED .7 DNX12 (GAUZE/BANDAGES/DRESSINGS) ×1 IMPLANT
DERMABOND ADVANCED .7 DNX6 (GAUZE/BANDAGES/DRESSINGS) ×1 IMPLANT
DRAIN CHANNEL 15F RND FF W/TCR (WOUND CARE) IMPLANT
DRAPE WARM FLUID 44X44 (DRAPE) ×2 IMPLANT
ELECT REM PT RETURN 9FT ADLT (ELECTROSURGICAL) ×2
ELECTRODE REM PT RTRN 9FT ADLT (ELECTROSURGICAL) ×1 IMPLANT
EVACUATOR SILICONE 100CC (DRAIN) IMPLANT
GLOVE BIO SURGEON STRL SZ 6.5 (GLOVE) ×10 IMPLANT
GLOVE BIO SURGEON STRL SZ7.5 (GLOVE) ×2 IMPLANT
GLOVE BIOGEL PI IND STRL 7.5 (GLOVE) ×1 IMPLANT
GLOVE BIOGEL PI IND STRL 8 (GLOVE) ×1 IMPLANT
GLOVE BIOGEL PI INDICATOR 7.5 (GLOVE) ×1
GLOVE BIOGEL PI INDICATOR 8 (GLOVE) ×1
GLOVE SURG SS PI 7.5 STRL IVOR (GLOVE) ×2 IMPLANT
GOWN PREVENTION PLUS XXLARGE (GOWN DISPOSABLE) ×4 IMPLANT
GOWN STRL NON-REIN LRG LVL3 (GOWN DISPOSABLE) ×8 IMPLANT
HEMOSTAT SNOW SURGICEL 2X4 (HEMOSTASIS) ×2 IMPLANT
HEMOSTAT SURGICEL 2X14 (HEMOSTASIS) IMPLANT
INSERT FOGARTY SM (MISCELLANEOUS) IMPLANT
KIT BASIN OR (CUSTOM PROCEDURE TRAY) ×2 IMPLANT
KIT ROOM TURNOVER OR (KITS) ×2 IMPLANT
NS IRRIG 1000ML POUR BTL (IV SOLUTION) ×4 IMPLANT
PACK CAROTID (CUSTOM PROCEDURE TRAY) ×2 IMPLANT
PAD ARMBOARD 7.5X6 YLW CONV (MISCELLANEOUS) ×4 IMPLANT
PATCH VASCULAR VASCU GUARD 1X6 (Vascular Products) ×2 IMPLANT
SHUNT CAROTID BYPASS 10 (VASCULAR PRODUCTS) IMPLANT
SHUNT CAROTID BYPASS 12FRX15.5 (VASCULAR PRODUCTS) IMPLANT
SPECIMEN JAR SMALL (MISCELLANEOUS) ×2 IMPLANT
SPONGE INTESTINAL PEANUT (DISPOSABLE) ×2 IMPLANT
SUT ETHILON 3 0 PS 1 (SUTURE) IMPLANT
SUT PROLENE 6 0 BV (SUTURE) ×2 IMPLANT
SUT PROLENE 7 0 BV 1 (SUTURE) IMPLANT
SUT PROLENE 7 0 BV1 MDA (SUTURE) ×2 IMPLANT
SUT VIC AB 3-0 SH 27 (SUTURE) ×2
SUT VIC AB 3-0 SH 27X BRD (SUTURE) ×2 IMPLANT
SUT VICRYL 4-0 PS2 18IN ABS (SUTURE) ×2 IMPLANT
TOWEL OR 17X24 6PK STRL BLUE (TOWEL DISPOSABLE) ×2 IMPLANT
TOWEL OR 17X26 10 PK STRL BLUE (TOWEL DISPOSABLE) ×2 IMPLANT
WATER STERILE IRR 1000ML POUR (IV SOLUTION) ×2 IMPLANT

## 2012-10-09 NOTE — Op Note (Signed)
[facility] [place]  Patient name: Shirley Shea MRN: [id number] DOB: June 02, 1936 Sex: female  10/09/2012 Pre-operative Diagnosis: Asymptomatic   left carotid stenosis Post-operative diagnosis:  Same Surgeon:  Jorge Ny Assistants:  C. Antony Blackbird Procedure:    left carotid Endarterectomy with bovine pericardial patch angioplasty Anesthesia:  General Blood Loss:  See anesthesia record Specimens:  Carotid Plaque to pathology  Findings:  85 %stenosis; Thrombus:  none  Indications:  Asymptomatic bilateral high grade stenosis  Procedure:  The patient was identified in the holding area and taken to [facility] OR ROOM 11  The patient was then placed supine on the table.   General endotrachial anesthesia was administered.  The patient was prepped and draped in the usual sterile fashion.  A time out was called and antibiotics were administered.  The incision was made along the anterior border of the left sternocleidomastoid muscle.  Cautery was used to dissect through the subcutaneous tissue.  The platysma muscle was divided with cautery.  The internal jugular vein was exposed along its anterior medial border.  The common facial vein was exposed and then divided between 2-0 silk ties and metal clips.  The common carotid artery was then circumferentially exposed and encircled with an umbilical tape.  The vagus nerve was identified and protected.  Next sharp dissection was used to expose the external carotid artery and the superior thyroid artery.  The were encircled with a blue vessel loop and a 2-0 silk tie respectively.  Finally, the internal carotid was carefully dissected free.  An umbilical tape was placed around the internal carotid artery distal to the diseased segment.  The hypoglossal nerve was visualized throughout and protected.  The patient was given systemic heparinization.  A bovine carotid patch was selected and prepared on the back table.  A 10 french shunt  was also prepared.  After blood pressure readings were appropriate and the heparin had been given time to circulate, the internal carotid artery was occluded with a baby Gregory clamp.  The external and common carotid arteries were then occluded with vascular clamps and the 2-0 tie tightened on the superior thyroid artery.  A #11 blade was used to make an arteriotomy in the common carotid artery.  This was extended with Potts scissors along the anterior and lateral border of the common and internal carotid artery.  Approximately 85% stenosis was identified.  There was no thrombus identified.  The 10 french shunt was not placed due to her having pulsatile backbleeding.  A kleiner kuntz elevator was used to perform endarterectomy.  An eversion endarterectomy was performed in the external carotid artery.  A good distal endpoint was obtained in the internal carotid artery.  The specimen was removed and sent to pathology.  Heparinized saline was used to irrigate the endarterectomized field.  All potential embolic debris was removed.  Bovine pericardial patch angioplasty was then performed using a running 6-0 Prolene. The common internal and external carotid arteries were all appropriately flushed. The artery was again irrigated with heparin saline.  The anastomosis was then secured. The clamp was first released on the external carotid artery followed by the common carotid artery approximately 30 seconds later, bloodflow was reestablish through the internal carotid artery.  Next, a hand-held  Doppler was used to evaluate the signals in the common, external, and internal  carotid arteries, all of which had appropriate signals. I then administered  50 mg protamine. The wound was then irrigated.  After  hemostasis was achieved, the carotid sheath was reapproximated with 3-0 Vicryl. The  platysma muscle was reapproximated with running 3-0 Vicryl. The skin  was closed with 4-0 Vicryl. Dermabond was placed on the skin. The    patient was then successfully extubated. Her neurologic exam was  similar to his preprocedural exam. The patient was then taken to recovery room  in stable condition. There were no complications.     Disposition:  To PACU in stable condition.  Relevant Operative Details:  Needle count at the end was off by one (BV1 needle missing)  An XRAY was taken, and no needle was seen.  This was read by radiology.  Excellent back-bleeding from the internal carotid.  No shunt was placed.  Juleen China, M.D. [facility] [place] Office: [phone] Pager:  [phone]

## 2012-10-09 NOTE — Anesthesia Preprocedure Evaluation (Addendum)
Anesthesia Evaluation  Patient identified by MRN, date of birth, ID band Patient awake    Reviewed: Allergy & Precautions, H&P , NPO status , Patient's Chart, lab work & pertinent test results, reviewed documented beta blocker date and time   History of Anesthesia Complications Negative for: history of anesthetic complications  Airway Mallampati: II TM Distance: >3 FB Neck ROM: Full    Dental No notable dental hx. (+) Teeth Intact, Caps, Implants and Dental Advisory Given   Pulmonary neg pulmonary ROS,  breath sounds clear to auscultation  Pulmonary exam normal       Cardiovascular hypertension, Pt. on medications and Pt. on home beta blockers + CAD and + Peripheral Vascular Disease (renal artery stenosis, carotid disease) Rhythm:Regular Rate:Normal  '09 ECHO: akinesis of inferoseptal wall, EF 55-60%   Neuro/Psych PSYCHIATRIC DISORDERS Anxiety Carotid artery disease    GI/Hepatic Neg liver ROS, GERD-  Medicated and Controlled,  Endo/Other  negative endocrine ROSSLE: plaquenil  Renal/GU Renal diseasenegative Renal ROS     Musculoskeletal   Abdominal   Peds  Hematology   Anesthesia Other Findings   Reproductive/Obstetrics                          Anesthesia Physical Anesthesia Plan  ASA: III  Anesthesia Plan: General   Post-op Pain Management:    Induction: Intravenous  Airway Management Planned: Oral ETT  Additional Equipment: Arterial line  Intra-op Plan:   Post-operative Plan: Extubation in OR  Informed Consent: I have reviewed the patients History and Physical, chart, labs and discussed the procedure including the risks, benefits and alternatives for the proposed anesthesia with the patient or authorized representative who has indicated his/her understanding and acceptance.   Dental advisory given  Plan Discussed with: Anesthesiologist, Surgeon and CRNA  Anesthesia Plan  Comments: (Plan routine monitors, A line, GETA)       Anesthesia Quick Evaluation

## 2012-10-09 NOTE — Transfer of Care (Signed)
Immediate Anesthesia Transfer of Care Note  Patient: Shirley Shea  Procedure(s) Performed: Procedure(s) (LRB) with comments: ENDARTERECTOMY CAROTID (Left) PATCH ANGIOPLASTY (Left)  Patient Location: PACU  Anesthesia Type:General  Level of Consciousness: awake, alert , oriented and patient cooperative  Airway & Oxygen Therapy: Patient Spontanous Breathing and Patient connected to face mask oxygen  Post-op Assessment: Report given to PACU RN, Post -op Vital signs reviewed and stable and Patient moving all extremities X 4 tongue midline following commands  Post vital signs: Reviewed and stable  Complications: No apparent anesthesia complications

## 2012-10-09 NOTE — Interval H&P Note (Signed)
History and Physical Interval Note:  10/09/2012 11:36 AM  Shirley Shea  has presented today for surgery, with the diagnosis of Left Internal Carotid Artery Stenosis  The various methods of treatment have been discussed with the patient and family. After consideration of risks, benefits and other options for treatment, the patient has consented to  Procedure(s) (LRB) with comments: ENDARTERECTOMY CAROTID (Left) as a surgical intervention .  The patient's history has been reviewed, patient examined, no change in status, stable for surgery.  I have reviewed the patient's chart and labs.  Questions were answered to the patient's satisfaction.     Daivon Rayos IV, V. WELLS

## 2012-10-09 NOTE — OR Nursing (Signed)
1423 Preop neuro- handgrips strong and equal, moves all 4 extremities,tongue midline.  Post op neuro check same as preop

## 2012-10-09 NOTE — H&P (View-Only) (Signed)
[facility] [place]   Patient name: Shirley Shea MRN: [id number] DOB: 11/09/1936 Sex: female   Referred by: Dr. Forest Gleason  Reason for referral:  Chief Complaint  Patient presents with  . New Evaluation    carotid stenosis/ asymptomatic Dr. Eldridge Dace    HISTORY OF PRESENT ILLNESS: The patient comes in today for evaluation of bilateral carotid stenosis. She has been followed by Dr. Forest Gleason for renal artery issues in the setting of hypertension and lupus. He auscultated a bilateral carotid bruit and proceeded with duplex ultrasound which revealed high-grade bilateral carotid stenosis. Therefore, the patient was sent over for discussions of carotid endarterectomy. The patient denies symptoms. Specifically, she denies numbness or weakness in either extremity. She denies slurred speech. She denies amaurosis fugax. She has had a couple episodes of losing her balance. She does not endorse dizziness.  The patient is medically managed for hypertension and hypercholesterolemia. She is also being treated for lupus. She is a nonsmoker and has never smoked.  Past Medical History  Diagnosis Date  . Diverticulosis   . Depression   . Hyperlipidemia   . IBS (irritable bowel syndrome)   . Allergy   . Hiatal hernia   . Carotid artery occlusion   . Hypertension   . Renal artery stenosis   . CAD (coronary artery disease)     Past Surgical History  Procedure Date  . Abdominal hysterectomy   . Rectal prolapse repair     [facility]. Dr. Mirian Mo    History   Social History  . Marital Status: Divorced    Spouse Name: N/A    Number of Children: N/A  . Years of Education: N/A   Occupational History  . Not on file.   Social History Main Topics  . Smoking status: Never Smoker   . Smokeless tobacco: Never Used  . Alcohol Use: 1.0 oz/week    2 drink(s) per week  . Drug Use: No  . Sexually Active: Not on file   Other Topics Concern  . Not on file   Social  History Narrative  . No narrative on file    Family History  Problem Relation Age of Onset  . Hyperlipidemia Mother   . Hypertension Mother   . Diabetes Father     Allergies as of 09/22/2012 - Review Complete 09/22/2012  Allergen Reaction Noted  . Ace inhibitors  09/15/2012  . Codeine  09/15/2012    Current Outpatient Prescriptions on File Prior to Visit  Medication Sig Dispense Refill  . alendronate (FOSAMAX) 40 MG tablet Take 70 mg by mouth every 7 (seven) days. Take with a full glass of water on an empty stomach.      . ALPRAZolam (XANAX) 0.5 MG tablet Take 0.5 mg by mouth at bedtime as needed.      Marland Kitchen aspirin 81 MG tablet Take 81 mg by mouth daily.      . Calcium Carb-Cholecalciferol (CALCIUM + D3) 600-200 MG-UNIT TABS Take 600 mg by mouth daily.      . cycloSPORINE (RESTASIS) 0.05 % ophthalmic emulsion 1 drop at bedtime. Emulsion 1 into affected eye      . esomeprazole (NEXIUM) 40 MG capsule Take 40 mg by mouth as needed.      Marland Kitchen estradiol (VIVELLE-DOT) 0.025 MG/24HR Place 1 patch onto the skin 2 (two) times a week.      . hydroxychloroquine (PLAQUENIL) 200 MG tablet Take 200 mg by mouth daily.      Marland Kitchen  metoprolol tartrate (LOPRESSOR) 25 MG tablet Take 25 mg by mouth 2 (two) times daily.      . niacin (NIASPAN) 500 MG CR tablet Take 500 mg by mouth at bedtime.      . simvastatin (ZOCOR) 20 MG tablet Take 20 mg by mouth every evening.      . zolpidem (AMBIEN) 10 MG tablet Take 5 mg by mouth at bedtime as needed.          REVIEW OF SYSTEMS: Cardiovascular: Positive for chest pressure, otherwise no chest pain, palpitations, orthopnea, or dyspnea on exertion. No claudication or rest pain,  No history of DVT or phlebitis. Pulmonary: No productive cough, asthma or wheezing. Neurologic: No weakness, paresthesias, aphasia, or amaurosis. No dizziness. Hematologic: No bleeding problems or clotting disorders. Musculoskeletal: No joint pain or joint swelling. Gastrointestinal: No blood  in stool or hematemesis Genitourinary: No dysuria or hematuria. Psychiatric:: No history of major depression. Integumentary: No rashes or ulcers. Constitutional: No fever or chills.  PHYSICAL EXAMINATION: General: The patient appears their stated age.  Vital signs are BP 175/60  Pulse 55  Ht 5\' 5"  (1.651 m)  Wt 124 lb (56.246 kg)  BMI 20.63 kg/m2  SpO2 100% HEENT:  No gross abnormalities Pulmonary: Respirations are non-labored Musculoskeletal: There are no major deformities.   Neurologic: No focal weakness or paresthesias are detected, Skin: There are no ulcer or rashes noted. Psychiatric: The patient has normal affect. Cardiovascular: There is a regular rate and rhythm without significant murmur appreciated. Bilateral carotid bruits  Diagnostic Studies: I have reviewed her outside ultrasound as well as an ultrasound performed here which shows bilateral greater than 80% carotid stenosis. The left side is 619/239, and the right side is 468/148 bifurcation is the mid hyaloid. The artery is normal past the stenosis.  Outside Studies/Documentation Historical records were reviewed.  They showed high lateral high-grade carotid stenosis, asymptomatic   Assessment:  Asymptomatic bilateral carotid stenosis, left greater than right Plan: I discussed our treatment options with the patient including endarterectomy versus stenting. Because she is low risk, asymptomatic, she is not a candidate for carotid stenting and therefore I have recommended carotid endarterectomy. We discussed the risks and benefits of the operation, including the risk of stroke, the risk of nerve injury, the risk of bleeding, the risk of cardiopulmonary complications, and the risk of infection. All of her questions were answered. I have scheduled her operation for Thursday, November 21. She will need staged bilateral carotid endarterectomy. Since the left side is the more severe side by ultrasound I would begin with a left  carotid endarterectomy. I am not getting any additional testing given her bilateral carotid stenosis, do to her renal disease.     Jorge Ny, M.D. [facility] [place] Office: [phone] Pager:  [phone]

## 2012-10-09 NOTE — Preoperative (Signed)
Beta Blockers   Reason not to administer Beta Blockers:Lopressor taken this am 

## 2012-10-09 NOTE — Anesthesia Postprocedure Evaluation (Signed)
Anesthesia Post Note  Patient: Shirley Shea  Procedure(s) Performed: Procedure(s) (LRB): ENDARTERECTOMY CAROTID (Left) PATCH ANGIOPLASTY (Left)  Anesthesia type: general  Patient location: PACU  Post pain: Pain level controlled  Post assessment: Patient's Cardiovascular Status Stable  Last Vitals:  Filed Vitals:   10/09/12 1602  BP: 137/49  Pulse: 64  Temp:   Resp: 20    Post vital signs: Reviewed and stable  Level of consciousness: sedated  Complications: No apparent anesthesia complications

## 2012-10-09 NOTE — Telephone Encounter (Addendum)
Message copied by Shari Prows on Thu Oct 09, 2012  2:28 PM ------      Message from: [place], [place] K      Created: Thu Oct 09, 2012  2:11 PM      Regarding: schedule                   ----- Message -----         From: Dara Lords, PA         Sent: 10/09/2012   1:54 PM           To: Sharee Pimple, CMA            S/p left CEA by Dr. Myra Gianotti 10/09/12.  Follow up with him in 2 weeks.            Thanks,      Lelon Mast  I scheduled appt for this patient for 10/27/12 at 8:45am. I left a voice mail message for her and I also mailed an appt letter.awt

## 2012-10-10 ENCOUNTER — Encounter (HOSPITAL_COMMUNITY): Payer: Self-pay | Admitting: Surgery

## 2012-10-10 LAB — BASIC METABOLIC PANEL
Chloride: 105 mEq/L (ref 96–112)
GFR calc Af Amer: 68 mL/min — ABNORMAL LOW (ref >90)
GFR calc non Af Amer: 59 mL/min — ABNORMAL LOW (ref >90)
Potassium: 3.8 mEq/L (ref 3.5–5.1)
Sodium: 136 mEq/L (ref 135–145)

## 2012-10-10 LAB — CBC
HCT: 31 % — ABNORMAL LOW (ref 36.0–46.0)
MCHC: 32.9 g/dL (ref 30.0–36.0)
RDW: 13.3 % (ref 11.5–15.5)
WBC: 9.7 10*3/uL (ref 4.0–10.5)

## 2012-10-10 MED ORDER — OXYCODONE HCL 5 MG PO TABS: 5.0000 mg | ORAL_TABLET | Freq: Four times a day (QID) | ORAL | Status: DC | PRN

## 2012-10-10 NOTE — Progress Notes (Signed)
[facility] Progress Note  10/10/2012 7:18 AM POD 1  Subjective:  Right shoulder/neck pain.   VSS 99%RA  Filed Vitals:   10/10/12 0415  BP: 111/43  Pulse: 65  Temp: 98.4 F (36.9 C)  Resp: 24     Physical Exam: Neuro:  In tact. Incision:  C/d/i   CBC    Component Value Date/Time   WBC 9.7 10/10/2012 0445   RBC 3.48* 10/10/2012 0445   HGB 10.2* 10/10/2012 0445   HCT 31.0* 10/10/2012 0445   PLT 156 10/10/2012 0445   MCV 89.1 10/10/2012 0445   MCH 29.3 10/10/2012 0445   MCHC 32.9 10/10/2012 0445   RDW 13.3 10/10/2012 0445    BMET    Component Value Date/Time   NA 136 10/10/2012 0445   K 3.8 10/10/2012 0445   CL 105 10/10/2012 0445   CO2 23 10/10/2012 0445   GLUCOSE 121* 10/10/2012 0445   BUN 12 10/10/2012 0445   CREATININE 0.93 10/10/2012 0445   CALCIUM 7.8* 10/10/2012 0445   GFRNONAA 59* 10/10/2012 0445   GFRAA 68* 10/10/2012 0445     Intake/Output Summary (Last 24 hours) at 10/10/12 0718 Last data filed at 10/10/12 0500  Gross per 24 hour  Intake 3902.5 ml  Output    450 ml  Net 3452.5 ml      Assessment/Plan:  This is a 76 y.o. female who is s/p left CEA POD 1  -doing well this am with only some shoulder pain on the right, which is probably due to positioning in the OR. -she has ambulated and voided. -discharge home today -f/u with Dr. Myra Gianotti in 2 weeks.  Doreatha Massed, PA-C [facility] [phone]

## 2012-10-10 NOTE — Progress Notes (Signed)
Discharge instructions given to patient and daughter with teach-back.  No complaints.

## 2012-10-10 NOTE — Discharge Summary (Signed)
[facility] Discharge Summary  Shirley Shea 04-09-1936 76 y.o. female  [id number]  Admission Date: 10/09/2012  Discharge Date: 10/10/12  Physician: Nada Libman, MD  Admission Diagnosis: Left Internal Carotid Artery Stenosis   HPI:   This is a 76 y.o. female patient comes in today for evaluation of bilateral carotid stenosis. She has been followed by Dr. Forest Gleason for renal artery issues in the setting of hypertension and lupus. He auscultated a bilateral carotid bruit and proceeded with duplex ultrasound which revealed high-grade bilateral carotid stenosis. Therefore, the patient was sent over for discussions of carotid endarterectomy. The patient denies symptoms. Specifically, she denies numbness or weakness in either extremity. She denies slurred speech. She denies amaurosis fugax. She has had a couple episodes of losing her balance. She does not endorse dizziness.  The patient is medically managed for hypertension and hypercholesterolemia. She is also being treated for lupus. She is a nonsmoker and has never smoked.   Hospital Course:  The patient was admitted to the hospital and taken to the operating room on 10/09/2012 and underwent left carotid endarterectomy.  The pt tolerated the procedure well and was transported to the PACU in good condition.   By POD 1, the pt neuro status in tact.  She is doing well and only complained of right shoulder pain.  The remainder of the hospital course consisted of increasing mobilization and increasing intake of solids without difficulty.    Basename 10/10/12 0445  NA 136  K 3.8  CL 105  CO2 23  GLUCOSE 121*  BUN 12  CALCIUM 7.8*    Basename 10/10/12 0445  WBC 9.7  HGB 10.2*  HCT 31.0*  PLT 156   No results found for this basename: INR:2 in the last 72 hours   Discharge Instructions:   The patient is discharged to home with extensive instructions on wound care and progressive ambulation.  They are  instructed not to drive or perform any heavy lifting until returning to see the physician in his office.  Discharge Orders    Future Appointments: Provider: Department: Dept Phone: Center:   10/27/2012 8:45 AM Nada Libman, MD [facility] [phone] [facility]     Future Orders Please Complete By Expires   Resume previous diet      Driving Restrictions      Comments:   No driving for 2 weeks   Lifting restrictions      Comments:   No lifting for 6 weeks   Call MD for:  temperature >100.5      Call MD for:  redness, tenderness, or signs of infection (pain, swelling, bleeding, redness, odor or green/yellow discharge around incision site)      Call MD for:  severe or increased pain, loss or decreased feeling  in affected limb(s)      Discharge wound care:      Comments:   Shower daily with soap and water starting 10/11/12   Nursing communication      Scheduling Instructions:   Please give paper Rx to patient at discharge.   CAROTID Sugery: Call MD for difficulty swallowing or speaking; weakness in arms or legs that is a new symtom; severe headache.  If you have increased swelling in the neck and/or  are having difficulty breathing, CALL 911         Discharge Diagnosis:  Left Internal Carotid Artery Stenosis  Secondary Diagnosis: Patient Active Problem List  Diagnosis  . Occlusion and  stenosis of carotid artery without mention of cerebral infarction   Past Medical History  Diagnosis Date  . Diverticulosis   . Hyperlipidemia   . IBS (irritable bowel syndrome)   . Allergy   . Carotid artery occlusion   . Hypertension   . Renal artery stenosis   . CAD (coronary artery disease)   . Anxiety   . Hiatal hernia   . Arthritis     hands    Nadalie, Laughner  Home Medication Instructions [id number]   Printed on:10/10/12 0730  Medication Information                    esomeprazole (NEXIUM) 40 MG capsule Take 40 mg by mouth daily.              zolpidem (AMBIEN) 10 MG tablet Take 5 mg by mouth at bedtime as needed. For sleep           hydroxychloroquine (PLAQUENIL) 200 MG tablet Take 200 mg by mouth daily.           estradiol (VIVELLE-DOT) 0.025 MG/24HR Place 1 patch onto the skin once a week. Apply on Fridays           niacin (NIASPAN) 500 MG CR tablet Take 500 mg by mouth at bedtime.           aspirin 81 MG tablet Take 81 mg by mouth daily.           simvastatin (ZOCOR) 20 MG tablet Take 20 mg by mouth every evening.           ALPRAZolam (XANAX) 0.5 MG tablet Take 0.5 mg by mouth daily.            alendronate (FOSAMAX) 40 MG tablet Take 70 mg by mouth every 7 (seven) days. Take with a full glass of water on an empty stomach. Take on Tuesdays           cycloSPORINE (RESTASIS) 0.05 % ophthalmic emulsion Place 1 drop into both eyes at bedtime. Emulsion 1 into affected eye           Calcium Carb-Cholecalciferol (CALCIUM + D3) 600-200 MG-UNIT TABS Take 600 mg by mouth daily.           metoprolol tartrate (LOPRESSOR) 25 MG tablet Take 25 mg by mouth 2 (two) times daily.           Multiple Vitamins-Minerals (ICAPS PO) Take 1 tablet by mouth daily.           escitalopram (LEXAPRO) 10 MG tablet Take 10 mg by mouth daily.           diphenhydrAMINE (BENADRYL) 25 mg capsule Take 25 mg by mouth every 6 (six) hours as needed. For allergies           oxyCODONE (ROXICODONE) 5 MG immediate release tablet Take 1 tablet (5 mg total) by mouth every 6 (six) hours as needed for pain. #30 NR              Disposition: home  Patient's condition: is Good  Follow up: 1. Dr.  Myra Gianotti in 2 weeks.   Doreatha Massed, PA-C [facility] [phone] 10/10/2012  7:26 AM

## 2012-10-13 NOTE — Progress Notes (Signed)
I agree with the above  Shirley Shea 

## 2012-10-13 NOTE — Discharge Summary (Signed)
I agree with the above. The patient remains neurologically intact status post carotid endarterectomy. Followup will be in 2 weeks.  Durene Cal

## 2012-10-24 ENCOUNTER — Encounter: Payer: Self-pay | Admitting: Surgery

## 2012-10-27 ENCOUNTER — Ambulatory Visit (INDEPENDENT_AMBULATORY_CARE_PROVIDER_SITE_OTHER): Payer: Medicare Other | Admitting: Surgery

## 2012-10-27 ENCOUNTER — Encounter: Payer: Self-pay | Admitting: Surgery

## 2012-10-27 ENCOUNTER — Telehealth: Payer: Self-pay | Admitting: Surgery

## 2012-10-27 VITALS — BP 150/60 | HR 64 | Temp 98.2°F | Resp 16 | Ht 65.0 in | Wt 125.0 lb

## 2012-10-27 DIAGNOSIS — Z48812 Encounter for surgical aftercare following surgery on the circulatory system: Secondary | ICD-10-CM

## 2012-10-27 DIAGNOSIS — N186 End stage renal disease: Secondary | ICD-10-CM

## 2012-10-27 NOTE — Telephone Encounter (Signed)
I called the patient regarding a referral to ENT per Dr.Brabham's instructions. I scheduled her to see Dr.Crossley on 10/30/12 at 1:30pm. She knows to arrive at 1:15pm. I faxed office notes to their office as well as a copy of her demographic information and insurance cards. awt

## 2012-10-27 NOTE — Progress Notes (Signed)
[facility] [place]  Patient name: Shirley Shea MRN: [id number] DOB: 10/01/1936 Sex: female  Chief Complaint   Patient presents with   .  Routine Post Op     s/p left CEA 10/09/2012 - pt has no complaints    HISTORY OF PRESENT ILLNESS:  The patient is back today for followup. She is status post left carotid endarterectomy for asymptomatic stenosis. Intraoperative findings included 85% stenosis. Her postoperative course was uncomplicated and she was discharged to home on postoperative day one. She has no complaints today. She did have a sore throat after her operation which has resolved. She has some numbness around her incision. She reports no weakness in her upper or lower extremity. She reports no slurred speech. She reports no trouble swallowing.  Past Medical History   Diagnosis  Date   .  Diverticulosis    .  Hyperlipidemia    .  IBS (irritable bowel syndrome)    .  Allergy    .  Carotid artery occlusion    .  Hypertension    .  Renal artery stenosis    .  CAD (coronary artery disease)    .  Anxiety    .  Hiatal hernia    .  Arthritis      hands    Past Surgical History   Procedure  Date   .  Abdominal hysterectomy    .  Rectal prolapse repair      [facility]. Dr. Greg Waters   .  Breast surgery      implants removed   .  Eye surgery      tear duct repair bilateral   .  Endarterectomy  10/09/2012     Procedure: ENDARTERECTOMY CAROTID; Surgeon: Bristyn Kulesza W Shalunda Lindh, MD; Location: [facility] OR; Service: Vascular; Laterality: Left;   .  Patch angioplasty  10/09/2012     Procedure: PATCH ANGIOPLASTY; Surgeon: Mayer Vondrak W Weltha Cathy, MD; Location: [facility] OR; Service: Vascular; Laterality: Left;    History    Social History   .  Marital Status:  Divorced     Spouse Name:  N/A     Number of Children:  N/A   .  Years of Education:  N/A    Occupational History   .  Not on file.    Social History Main Topics   .  Smoking status:  Never Smoker   .  Smokeless tobacco:   Never Used   .  Alcohol Use:  1.0 oz/week     2 drink(s) per week   .  Drug Use:  No   .  Sexually Active:  Not on file    Other Topics  Concern   .  Not on file    Social History Narrative   .  No narrative on file    Family History   Problem  Relation  Age of Onset   .  Hyperlipidemia  Mother    .  Hypertension  Mother    .  Diabetes  Father     Allergies as of 10/27/2012 - Review Complete 10/27/2012   Allergen  Reaction  Noted   .  Ace inhibitors   09/15/2012   .  Codeine   09/15/2012    Current Outpatient Prescriptions on File Prior to Visit   Medication  Sig  Dispense  Refill   .  alendronate (FOSAMAX) 40 MG tablet  Take 70 mg by mouth every 7 (seven) days. Take with   a full glass of water on an empty stomach. Take on Tuesdays     .  ALPRAZolam (XANAX) 0.5 MG tablet  Take 0.5 mg by mouth daily.     .  aspirin 81 MG tablet  Take 81 mg by mouth daily.     .  Calcium Carb-Cholecalciferol (CALCIUM + D3) 600-200 MG-UNIT TABS  Take 600 mg by mouth daily.     .  cycloSPORINE (RESTASIS) 0.05 % ophthalmic emulsion  Place 1 drop into both eyes at bedtime. Emulsion 1 into affected eye     .  diphenhydrAMINE (BENADRYL) 25 mg capsule  Take 25 mg by mouth every 6 (six) hours as needed. For allergies     .  escitalopram (LEXAPRO) 10 MG tablet  Take 10 mg by mouth daily.     .  esomeprazole (NEXIUM) 40 MG capsule  Take 40 mg by mouth daily.     .  estradiol (VIVELLE-DOT) 0.025 MG/24HR  Place 1 patch onto the skin once a week. Apply on Fridays     .  hydroxychloroquine (PLAQUENIL) 200 MG tablet  Take 200 mg by mouth daily.     .  metoprolol tartrate (LOPRESSOR) 25 MG tablet  Take 25 mg by mouth 2 (two) times daily.     .  Multiple Vitamins-Minerals (ICAPS PO)  Take 1 tablet by mouth daily.     .  niacin (NIASPAN) 500 MG CR tablet  Take 500 mg by mouth 2 (two) times daily.     .  oxyCODONE (ROXICODONE) 5 MG immediate release tablet  Take 1 tablet (5 mg total) by mouth every 6 (six) hours as  needed for pain.  30 tablet  0   .  simvastatin (ZOCOR) 20 MG tablet  Take 20 mg by mouth every evening.     .  zolpidem (AMBIEN) 10 MG tablet  Take 5 mg by mouth at bedtime as needed. For sleep      REVIEW OF SYSTEMS:  No changes from previous visit  PHYSICAL EXAMINATION:  Vital signs are BP 150/60  Pulse 64  Temp 98.2 F (36.8 C) (Oral)  Resp 16  Ht 5' 5" (1.651 m)  Wt 125 lb (56.7 kg)  BMI 20.80 kg/m2  SpO2 100%  General: The patient appears their stated age.  HEENT: No gross abnormalities  Pulmonary: Non labored breathing  Musculoskeletal: There are no major deformities.  Neurologic: No focal weakness or paresthesias are detected,  Skin: There are no ulcer or rashes noted.  Psychiatric: The patient has normal affect.  Cardiovascular: Left carotid endarterectomy incision is well-healed.  Diagnostic Studies  None  Assessment:  Status post left carotid endarterectomy  Plan:  The patient is doing very well from her left carotid endarterectomy. She also has a high-grade right carotid stenosis. She has been scheduled for right carotid endarterectomy, to be performed: Thursday, January 9. I will have her see ENT prior to her operation to evaluate her vocal cords. She shows no evidence of vocal cord injury at this time. She does have occasional hoarseness, which is her baseline and was present before operation  V. [place] Deirdre Gryder IV, M.D.  [facility] [place]  Office: [phone]  Pager: [phone]     

## 2012-11-05 ENCOUNTER — Other Ambulatory Visit: Payer: Self-pay

## 2012-11-17 ENCOUNTER — Encounter (HOSPITAL_COMMUNITY): Payer: Self-pay | Admitting: Respiratory Therapy

## 2012-11-20 NOTE — Pre-Procedure Instructions (Addendum)
20 JANIRA MANDELL  11/20/2012   Your procedure is scheduled on:  Thursday  , January 9TH   Report to [facility] at 730 AM.  Call this number if you have problems the morning of surgery: [phone]   Remember:   Do not eat food OR DRINK :After Midnight.    Take these medicines the morning of surgery with A SIP OF WATER: xanax, nexium, lopressor  STOP plaquinil now   Do not wear jewelry, make-up or nail polish.  Do not wear lotions, powders, or perfumes. You may not wear deodorant.  Do not shave 48 hours prior to surgery. Men may shave face and neck.  Do not bring valuables to the hospital.  Contacts, dentures or bridgework may not be worn into surgery.  Leave suitcase in the car. After surgery it may be brought to your room.  For patients admitted to the hospital, checkout time is 11:00 AM the day of discharge.   Patients discharged the day of surgery will not be allowed to drive home.  Name and phone number of your driver: dtr Misty Stanley [phone]  Special Instructions: Shower using CHG 2 nights before surgery and the night before surgery.  If you shower the day of surgery use CHG.  Use special wash - you have one bottle of CHG for all showers.  You should use approximately 1/3 of the bottle for each shower.   Please read over the following fact sheets that you were given: Pain Booklet, Coughing and Deep Breathing, Blood Transfusion Information, MRSA Information and Surgical Site Infection Prevention

## 2012-11-21 ENCOUNTER — Encounter (HOSPITAL_COMMUNITY): Payer: Self-pay

## 2012-11-21 ENCOUNTER — Encounter (HOSPITAL_COMMUNITY)
Admission: RE | Admit: 2012-11-21 | Discharge: 2012-11-21 | Disposition: A | Discharge status: Home / Self Care | Payer: Medicare Other | Source: Ambulatory Visit | Attending: Surgery | Admitting: Surgery

## 2012-11-21 HISTORY — DX: Gastro-esophageal reflux disease without esophagitis: K21.9

## 2012-11-21 LAB — CBC
HCT: 37.4 % (ref 36.0–46.0)
MCH: 28 pg (ref 26.0–34.0)
MCHC: 31.3 g/dL (ref 30.0–36.0)
MCV: 89.5 fL (ref 78.0–100.0)
Platelets: 194 10*3/uL (ref 150–400)
RDW: 13.4 % (ref 11.5–15.5)
WBC: 4.4 10*3/uL (ref 4.0–10.5)

## 2012-11-21 LAB — PROTIME-INR: Prothrombin Time: 13 seconds (ref 11.6–15.2)

## 2012-11-21 LAB — COMPREHENSIVE METABOLIC PANEL
AST: 23 U/L (ref 0–37)
Albumin: 3.7 g/dL (ref 3.5–5.2)
BUN: 11 mg/dL (ref 6–23)
Calcium: 9.3 mg/dL (ref 8.4–10.5)
Chloride: 104 mEq/L (ref 96–112)
Creatinine, Ser: 1.04 mg/dL (ref 0.50–1.10)
Total Bilirubin: 0.3 mg/dL (ref 0.3–1.2)

## 2012-11-21 LAB — URINALYSIS, ROUTINE W REFLEX MICROSCOPIC
Bilirubin Urine: NEGATIVE
Hgb urine dipstick: NEGATIVE
Nitrite: NEGATIVE
Protein, ur: NEGATIVE mg/dL
Specific Gravity, Urine: 1.011 (ref 1.005–1.030)
Urobilinogen, UA: 0.2 mg/dL (ref 0.0–1.0)

## 2012-11-21 LAB — TYPE AND SCREEN
ABO/RH(D): A NEG
Antibody Screen: NEGATIVE

## 2012-11-21 LAB — SURGICAL PCR SCREEN: MRSA, PCR: NEGATIVE

## 2012-11-21 LAB — URINE MICROSCOPIC-ADD ON

## 2012-11-21 NOTE — Progress Notes (Signed)
req'd notes, any tests from dr Lynnell Grain cardiology

## 2012-11-26 MED ORDER — SODIUM CHLORIDE 0.9 % IV SOLN: INTRAVENOUS | Status: DC

## 2012-11-26 MED ORDER — DEXTROSE 5 % IV SOLN
1.5000 g | INTRAVENOUS | Status: AC
  Administered 2012-11-27: 1.5 g via INTRAVENOUS
  Filled 2012-11-26: qty 1.5

## 2012-11-27 ENCOUNTER — Encounter (HOSPITAL_COMMUNITY): Payer: Self-pay | Admitting: *Deleted

## 2012-11-27 ENCOUNTER — Telehealth: Payer: Self-pay | Admitting: Surgery

## 2012-11-27 ENCOUNTER — Inpatient Hospital Stay (HOSPITAL_COMMUNITY): Payer: Medicare Other | Admitting: Anesthesiology

## 2012-11-27 ENCOUNTER — Encounter (HOSPITAL_COMMUNITY): Payer: Self-pay | Admitting: Anesthesiology

## 2012-11-27 ENCOUNTER — Inpatient Hospital Stay (HOSPITAL_COMMUNITY)
Admission: RE | Admit: 2012-11-27 | Discharge: 2012-11-28 | DRG: 039 | Disposition: A | Discharge status: Home / Self Care | Payer: Medicare Other | Source: Ambulatory Visit | Attending: Surgery | Admitting: Surgery

## 2012-11-27 ENCOUNTER — Encounter (HOSPITAL_COMMUNITY)
Admission: RE | Disposition: A | Discharge status: Home / Self Care | Payer: Self-pay | Source: Ambulatory Visit | Attending: Surgery

## 2012-11-27 DIAGNOSIS — K449 Diaphragmatic hernia without obstruction or gangrene: Secondary | ICD-10-CM | POA: Diagnosis present

## 2012-11-27 DIAGNOSIS — F411 Generalized anxiety disorder: Secondary | ICD-10-CM | POA: Diagnosis present

## 2012-11-27 DIAGNOSIS — K219 Gastro-esophageal reflux disease without esophagitis: Secondary | ICD-10-CM | POA: Diagnosis present

## 2012-11-27 DIAGNOSIS — I701 Atherosclerosis of renal artery: Secondary | ICD-10-CM | POA: Diagnosis present

## 2012-11-27 DIAGNOSIS — I6529 Occlusion and stenosis of unspecified carotid artery: Principal | ICD-10-CM

## 2012-11-27 DIAGNOSIS — Z7982 Long term (current) use of aspirin: Secondary | ICD-10-CM

## 2012-11-27 DIAGNOSIS — I2510 Atherosclerotic heart disease of native coronary artery without angina pectoris: Secondary | ICD-10-CM | POA: Diagnosis present

## 2012-11-27 DIAGNOSIS — Z79899 Other long term (current) drug therapy: Secondary | ICD-10-CM

## 2012-11-27 DIAGNOSIS — M19049 Primary osteoarthritis, unspecified hand: Secondary | ICD-10-CM | POA: Diagnosis present

## 2012-11-27 DIAGNOSIS — E785 Hyperlipidemia, unspecified: Secondary | ICD-10-CM | POA: Diagnosis present

## 2012-11-27 DIAGNOSIS — K589 Irritable bowel syndrome without diarrhea: Secondary | ICD-10-CM | POA: Diagnosis present

## 2012-11-27 DIAGNOSIS — I10 Essential (primary) hypertension: Secondary | ICD-10-CM | POA: Diagnosis present

## 2012-11-27 HISTORY — PX: PATCH ANGIOPLASTY: SHX6230

## 2012-11-27 HISTORY — PX: ENDARTERECTOMY: SHX5162

## 2012-11-27 SURGERY — ENDARTERECTOMY, CAROTID
Anesthesia: General | Site: Neck | Laterality: Right

## 2012-11-27 MED ORDER — ESCITALOPRAM OXALATE 10 MG PO TABS
10.0000 mg | ORAL_TABLET | Freq: Every day | ORAL | Status: DC
  Administered 2012-11-27: 10 mg via ORAL
  Filled 2012-11-27 (×2): qty 1

## 2012-11-27 MED ORDER — DOPAMINE-DEXTROSE 3.2-5 MG/ML-% IV SOLN
INTRAVENOUS | Status: AC
  Administered 2012-11-27: 3 ug/kg/min via INTRAVENOUS
  Filled 2012-11-27: qty 250

## 2012-11-27 MED ORDER — LIDOCAINE HCL (CARDIAC) 20 MG/ML IV SOLN
INTRAVENOUS | Status: DC | PRN
  Administered 2012-11-27: 100 mg via INTRAVENOUS

## 2012-11-27 MED ORDER — 0.9 % SODIUM CHLORIDE (POUR BTL) OPTIME
TOPICAL | Status: DC | PRN
  Administered 2012-11-27: 1000 mL

## 2012-11-27 MED ORDER — CALCIUM + D3 600-200 MG-UNIT PO TABS
1.0000 | ORAL_TABLET | Freq: Every day | ORAL | Status: DC
Start: 2012-11-27 — End: 2012-11-27

## 2012-11-27 MED ORDER — DOPAMINE-DEXTROSE 3.2-5 MG/ML-% IV SOLN: 3.0000 ug/kg/min | INTRAVENOUS | Status: DC

## 2012-11-27 MED ORDER — OXYCODONE-ACETAMINOPHEN 5-325 MG PO TABS
1.0000 | ORAL_TABLET | ORAL | Status: DC | PRN
  Administered 2012-11-28: 1 via ORAL
  Filled 2012-11-27: qty 1

## 2012-11-27 MED ORDER — SODIUM CHLORIDE 0.9 % IV SOLN
INTRAVENOUS | Status: DC
  Administered 2012-11-27: 17:00:00 via INTRAVENOUS

## 2012-11-27 MED ORDER — ONDANSETRON HCL 4 MG/2ML IJ SOLN: 4.0000 mg | Freq: Once | INTRAMUSCULAR | Status: DC | PRN

## 2012-11-27 MED ORDER — ONDANSETRON HCL 4 MG/2ML IJ SOLN
INTRAMUSCULAR | Status: DC | PRN
  Administered 2012-11-27: 4 mg via INTRAVENOUS

## 2012-11-27 MED ORDER — LABETALOL HCL 5 MG/ML IV SOLN
INTRAVENOUS | Status: DC | PRN
  Administered 2012-11-27: 5 mg via INTRAVENOUS
  Administered 2012-11-27: 10 mg via INTRAVENOUS
  Administered 2012-11-27: 5 mg via INTRAVENOUS

## 2012-11-27 MED ORDER — SODIUM CHLORIDE 0.9 % IR SOLN
Status: DC | PRN
  Administered 2012-11-27: 12:00:00

## 2012-11-27 MED ORDER — PANTOPRAZOLE SODIUM 40 MG PO TBEC
40.0000 mg | DELAYED_RELEASE_TABLET | Freq: Every day | ORAL | Status: DC
  Administered 2012-11-27 – 2012-11-28 (×2): 40 mg via ORAL
  Filled 2012-11-27 (×2): qty 1

## 2012-11-27 MED ORDER — ACETAMINOPHEN 325 MG PO TABS
325.0000 mg | ORAL_TABLET | ORAL | Status: DC | PRN
  Administered 2012-11-27: 650 mg via ORAL
  Filled 2012-11-27: qty 2

## 2012-11-27 MED ORDER — ASPIRIN EC 81 MG PO TBEC
81.0000 mg | DELAYED_RELEASE_TABLET | Freq: Every day | ORAL | Status: DC
  Filled 2012-11-27: qty 1

## 2012-11-27 MED ORDER — METOPROLOL TARTRATE 1 MG/ML IV SOLN: 2.0000 mg | INTRAVENOUS | Status: DC | PRN

## 2012-11-27 MED ORDER — GLYCOPYRROLATE 0.2 MG/ML IJ SOLN
INTRAMUSCULAR | Status: AC
  Filled 2012-11-27: qty 1

## 2012-11-27 MED ORDER — PHENOL 1.4 % MT LIQD: 1.0000 | OROMUCOSAL | Status: DC | PRN

## 2012-11-27 MED ORDER — PROTAMINE SULFATE 10 MG/ML IV SOLN
INTRAVENOUS | Status: DC | PRN
  Administered 2012-11-27 (×5): 10 mg via INTRAVENOUS

## 2012-11-27 MED ORDER — PHENYLEPHRINE HCL 10 MG/ML IJ SOLN
INTRAMUSCULAR | Status: DC | PRN
  Administered 2012-11-27: 120 ug via INTRAVENOUS

## 2012-11-27 MED ORDER — BISACODYL 10 MG RE SUPP: 10.0000 mg | Freq: Every day | RECTAL | Status: DC | PRN

## 2012-11-27 MED ORDER — ARTIFICIAL TEARS OP OINT
TOPICAL_OINTMENT | OPHTHALMIC | Status: DC | PRN
  Administered 2012-11-27: 1 via OPHTHALMIC

## 2012-11-27 MED ORDER — POTASSIUM CHLORIDE CRYS ER 20 MEQ PO TBCR: 20.0000 meq | EXTENDED_RELEASE_TABLET | Freq: Once | ORAL | Status: AC | PRN

## 2012-11-27 MED ORDER — LIDOCAINE HCL 4 % MT SOLN
OROMUCOSAL | Status: DC | PRN
  Administered 2012-11-27: 4 mL via TOPICAL

## 2012-11-27 MED ORDER — MAGNESIUM SULFATE 40 MG/ML IJ SOLN
2.0000 g | Freq: Once | INTRAMUSCULAR | Status: AC | PRN
  Filled 2012-11-27: qty 50

## 2012-11-27 MED ORDER — HEMOSTATIC AGENTS (NO CHARGE) OPTIME
TOPICAL | Status: DC | PRN
  Administered 2012-11-27: 1 via TOPICAL

## 2012-11-27 MED ORDER — ALUM & MAG HYDROXIDE-SIMETH 200-200-20 MG/5ML PO SUSP: 15.0000 mL | ORAL | Status: DC | PRN

## 2012-11-27 MED ORDER — CYCLOSPORINE 0.05 % OP EMUL
1.0000 [drp] | Freq: Every day | OPHTHALMIC | Status: DC
  Administered 2012-11-27: 1 [drp] via OPHTHALMIC
  Filled 2012-11-27 (×3): qty 1

## 2012-11-27 MED ORDER — DEXTROSE 5 % IV SOLN
1.5000 g | Freq: Two times a day (BID) | INTRAVENOUS | Status: AC
  Administered 2012-11-27 – 2012-11-28 (×2): 1.5 g via INTRAVENOUS
  Filled 2012-11-27 (×2): qty 1.5

## 2012-11-27 MED ORDER — LACTATED RINGERS IV SOLN
INTRAVENOUS | Status: DC
  Administered 2012-11-27: 09:00:00 via INTRAVENOUS

## 2012-11-27 MED ORDER — PROPOFOL 10 MG/ML IV BOLUS
INTRAVENOUS | Status: DC | PRN
  Administered 2012-11-27: 150 mg via INTRAVENOUS

## 2012-11-27 MED ORDER — ACETAMINOPHEN 325 MG RE SUPP
325.0000 mg | RECTAL | Status: DC | PRN
  Filled 2012-11-27: qty 2

## 2012-11-27 MED ORDER — SODIUM CHLORIDE 0.9 % IV SOLN
500.0000 mL | Freq: Once | INTRAVENOUS | Status: AC | PRN
  Administered 2012-11-27: 500 mL via INTRAVENOUS

## 2012-11-27 MED ORDER — ASPIRIN 81 MG PO TABS: 81.0000 mg | ORAL_TABLET | Freq: Every day | ORAL | Status: DC

## 2012-11-27 MED ORDER — HYDRALAZINE HCL 20 MG/ML IJ SOLN: 10.0000 mg | INTRAMUSCULAR | Status: DC | PRN

## 2012-11-27 MED ORDER — ESTRADIOL 0.05 MG/24HR TD PTWK
0.0500 mg | MEDICATED_PATCH | TRANSDERMAL | Status: DC
  Administered 2012-11-28: 0.05 mg via TRANSDERMAL
  Filled 2012-11-27: qty 1

## 2012-11-27 MED ORDER — METOPROLOL TARTRATE 25 MG PO TABS
25.0000 mg | ORAL_TABLET | Freq: Two times a day (BID) | ORAL | Status: DC
Start: 2012-11-27 — End: 2012-11-28
  Filled 2012-11-27 (×3): qty 1

## 2012-11-27 MED ORDER — HEPARIN SODIUM (PORCINE) 1000 UNIT/ML IJ SOLN
INTRAMUSCULAR | Status: DC | PRN
  Administered 2012-11-27: 6000 [IU] via INTRAVENOUS

## 2012-11-27 MED ORDER — GUAIFENESIN-DM 100-10 MG/5ML PO SYRP: 15.0000 mL | ORAL_SOLUTION | ORAL | Status: DC | PRN

## 2012-11-27 MED ORDER — SIMVASTATIN 20 MG PO TABS
20.0000 mg | ORAL_TABLET | Freq: Every evening | ORAL | Status: DC
  Filled 2012-11-27 (×2): qty 1

## 2012-11-27 MED ORDER — HYDROMORPHONE HCL PF 1 MG/ML IJ SOLN
0.2500 mg | INTRAMUSCULAR | Status: DC | PRN
  Administered 2012-11-27 (×3): 0.25 mg via INTRAVENOUS

## 2012-11-27 MED ORDER — SENNOSIDES-DOCUSATE SODIUM 8.6-50 MG PO TABS
1.0000 | ORAL_TABLET | Freq: Every evening | ORAL | Status: DC | PRN
  Filled 2012-11-27: qty 1

## 2012-11-27 MED ORDER — LABETALOL HCL 5 MG/ML IV SOLN: 10.0000 mg | INTRAVENOUS | Status: DC | PRN

## 2012-11-27 MED ORDER — GLYCOPYRROLATE 0.2 MG/ML IJ SOLN
INTRAMUSCULAR | Status: DC | PRN
  Administered 2012-11-27: .2 mg via INTRAVENOUS
  Administered 2012-11-27: .4 mg via INTRAVENOUS
  Administered 2012-11-27: .2 mg via INTRAVENOUS

## 2012-11-27 MED ORDER — HYDROMORPHONE HCL PF 1 MG/ML IJ SOLN
INTRAMUSCULAR | Status: AC
  Filled 2012-11-27: qty 1

## 2012-11-27 MED ORDER — ONDANSETRON HCL 4 MG/2ML IJ SOLN
4.0000 mg | Freq: Four times a day (QID) | INTRAMUSCULAR | Status: DC | PRN
  Administered 2012-11-27: 4 mg via INTRAVENOUS
  Filled 2012-11-27: qty 2

## 2012-11-27 MED ORDER — ALPRAZOLAM 0.5 MG PO TABS
0.5000 mg | ORAL_TABLET | Freq: Every day | ORAL | Status: DC
  Filled 2012-11-27: qty 1

## 2012-11-27 MED ORDER — NIACIN ER (ANTIHYPERLIPIDEMIC) 500 MG PO TBCR
500.0000 mg | EXTENDED_RELEASE_TABLET | Freq: Two times a day (BID) | ORAL | Status: DC
  Administered 2012-11-27 – 2012-11-28 (×2): 500 mg via ORAL
  Filled 2012-11-27 (×4): qty 1

## 2012-11-27 MED ORDER — NEOSTIGMINE METHYLSULFATE 1 MG/ML IJ SOLN
INTRAMUSCULAR | Status: DC | PRN
  Administered 2012-11-27: 5 mg via INTRAVENOUS

## 2012-11-27 MED ORDER — DIPHENHYDRAMINE HCL 25 MG PO CAPS: 25.0000 mg | ORAL_CAPSULE | Freq: Four times a day (QID) | ORAL | Status: DC | PRN

## 2012-11-27 MED ORDER — ZOLPIDEM TARTRATE 5 MG PO TABS: 5.0000 mg | ORAL_TABLET | Freq: Every evening | ORAL | Status: DC | PRN

## 2012-11-27 MED ORDER — ROCURONIUM BROMIDE 100 MG/10ML IV SOLN
INTRAVENOUS | Status: DC | PRN
  Administered 2012-11-27: 30 mg via INTRAVENOUS

## 2012-11-27 MED ORDER — DOCUSATE SODIUM 100 MG PO CAPS
100.0000 mg | ORAL_CAPSULE | Freq: Every day | ORAL | Status: DC
  Administered 2012-11-28: 100 mg via ORAL
  Filled 2012-11-27: qty 1

## 2012-11-27 MED ORDER — MORPHINE SULFATE 2 MG/ML IJ SOLN
2.0000 mg | INTRAMUSCULAR | Status: DC | PRN
  Administered 2012-11-27 – 2012-11-28 (×2): 2 mg via INTRAVENOUS
  Filled 2012-11-27 (×2): qty 1

## 2012-11-27 MED ORDER — PHENYLEPHRINE HCL 10 MG/ML IJ SOLN
10.0000 mg | INTRAVENOUS | Status: DC | PRN
  Administered 2012-11-27: 25 ug/min via INTRAVENOUS

## 2012-11-27 MED ORDER — HYDROXYCHLOROQUINE SULFATE 200 MG PO TABS
200.0000 mg | ORAL_TABLET | Freq: Every day | ORAL | Status: DC
  Administered 2012-11-27 – 2012-11-28 (×2): 200 mg via ORAL
  Filled 2012-11-27 (×2): qty 1

## 2012-11-27 MED ORDER — CALCIUM CARBONATE-VITAMIN D 500-200 MG-UNIT PO TABS
1.0000 | ORAL_TABLET | Freq: Every day | ORAL | Status: DC
  Administered 2012-11-27 – 2012-11-28 (×2): 1 via ORAL
  Filled 2012-11-27 (×2): qty 1

## 2012-11-27 MED ORDER — FENTANYL CITRATE 0.05 MG/ML IJ SOLN
INTRAMUSCULAR | Status: DC | PRN
  Administered 2012-11-27: 50 ug via INTRAVENOUS

## 2012-11-27 MED ORDER — PANTOPRAZOLE SODIUM 40 MG PO TBEC: 40.0000 mg | DELAYED_RELEASE_TABLET | Freq: Every day | ORAL | Status: DC

## 2012-11-27 MED ORDER — ASPIRIN EC 325 MG PO TBEC
325.0000 mg | DELAYED_RELEASE_TABLET | Freq: Every day | ORAL | Status: DC
  Filled 2012-11-27: qty 1

## 2012-11-27 SURGICAL SUPPLY — 52 items
CANISTER SUCTION 2500CC (MISCELLANEOUS) ×2 IMPLANT
CATH ROBINSON RED A/P 18FR (CATHETERS) ×2 IMPLANT
CATH SUCT 10FR WHISTLE TIP (CATHETERS) ×2 IMPLANT
CLIP TI MEDIUM 24 (CLIP) ×2 IMPLANT
CLIP TI WIDE RED SMALL 24 (CLIP) ×2 IMPLANT
CLOTH BEACON ORANGE TIMEOUT ST (SAFETY) ×2 IMPLANT
COVER SURGICAL LIGHT HANDLE (MISCELLANEOUS) ×2 IMPLANT
CRADLE DONUT ADULT HEAD (MISCELLANEOUS) ×2 IMPLANT
DERMABOND ADVANCED (GAUZE/BANDAGES/DRESSINGS) ×1
DERMABOND ADVANCED .7 DNX12 (GAUZE/BANDAGES/DRESSINGS) ×1 IMPLANT
DRAIN CHANNEL 15F RND FF W/TCR (WOUND CARE) IMPLANT
DRAPE WARM FLUID 44X44 (DRAPE) ×2 IMPLANT
ELECT REM PT RETURN 9FT ADLT (ELECTROSURGICAL) ×2
ELECTRODE REM PT RTRN 9FT ADLT (ELECTROSURGICAL) ×1 IMPLANT
EVACUATOR SILICONE 100CC (DRAIN) IMPLANT
GLOVE BIOGEL PI IND STRL 6.5 (GLOVE) ×2 IMPLANT
GLOVE BIOGEL PI IND STRL 7.5 (GLOVE) ×3 IMPLANT
GLOVE BIOGEL PI INDICATOR 6.5 (GLOVE) ×2
GLOVE BIOGEL PI INDICATOR 7.5 (GLOVE) ×3
GLOVE SS BIOGEL STRL SZ 6.5 (GLOVE) ×1 IMPLANT
GLOVE SS BIOGEL STRL SZ 7 (GLOVE) ×1 IMPLANT
GLOVE SUPERSENSE BIOGEL SZ 6.5 (GLOVE) ×1
GLOVE SUPERSENSE BIOGEL SZ 7 (GLOVE) ×1
GLOVE SURG SS PI 7.5 STRL IVOR (GLOVE) ×4 IMPLANT
GOWN PREVENTION PLUS XLARGE (GOWN DISPOSABLE) ×2 IMPLANT
GOWN PREVENTION PLUS XXLARGE (GOWN DISPOSABLE) ×2 IMPLANT
GOWN STRL NON-REIN LRG LVL3 (GOWN DISPOSABLE) ×2 IMPLANT
GOWN STRL REIN XL XLG (GOWN DISPOSABLE) ×8 IMPLANT
HEMOSTAT SNOW SURGICEL 2X4 (HEMOSTASIS) ×2 IMPLANT
HEMOSTAT SURGICEL 2X14 (HEMOSTASIS) IMPLANT
INSERT FOGARTY SM (MISCELLANEOUS) IMPLANT
KIT BASIN OR (CUSTOM PROCEDURE TRAY) ×2 IMPLANT
KIT ROOM TURNOVER OR (KITS) ×2 IMPLANT
NS IRRIG 1000ML POUR BTL (IV SOLUTION) ×4 IMPLANT
PACK CAROTID (CUSTOM PROCEDURE TRAY) ×2 IMPLANT
PAD ARMBOARD 7.5X6 YLW CONV (MISCELLANEOUS) ×4 IMPLANT
PATCH VASCULAR VASCU GUARD 1X6 (Vascular Products) ×2 IMPLANT
SHUNT CAROTID BYPASS 10 (VASCULAR PRODUCTS) IMPLANT
SHUNT CAROTID BYPASS 12FRX15.5 (VASCULAR PRODUCTS) IMPLANT
SPECIMEN JAR SMALL (MISCELLANEOUS) ×2 IMPLANT
SPONGE INTESTINAL PEANUT (DISPOSABLE) ×2 IMPLANT
SUT ETHILON 3 0 PS 1 (SUTURE) IMPLANT
SUT PROLENE 6 0 BV (SUTURE) ×4 IMPLANT
SUT PROLENE 7 0 BV 1 (SUTURE) IMPLANT
SUT SILK 3 0 TIES 17X18 (SUTURE)
SUT SILK 3-0 18XBRD TIE BLK (SUTURE) IMPLANT
SUT VIC AB 3-0 SH 27 (SUTURE) ×2
SUT VIC AB 3-0 SH 27X BRD (SUTURE) ×2 IMPLANT
SUT VICRYL 4-0 PS2 18IN ABS (SUTURE) ×2 IMPLANT
TOWEL OR 17X24 6PK STRL BLUE (TOWEL DISPOSABLE) ×2 IMPLANT
TOWEL OR 17X26 10 PK STRL BLUE (TOWEL DISPOSABLE) ×2 IMPLANT
WATER STERILE IRR 1000ML POUR (IV SOLUTION) ×2 IMPLANT

## 2012-11-27 NOTE — Anesthesia Procedure Notes (Signed)
Procedure Name: Intubation Date/Time: 11/27/2012 11:19 AM Performed by: Sherie Don Pre-anesthesia Checklist: Patient identified, Emergency Drugs available, Suction available, Patient being monitored and Timeout performed Patient Re-evaluated:Patient Re-evaluated prior to inductionOxygen Delivery Method: Circle system utilized Preoxygenation: Pre-oxygenation with 100% oxygen Intubation Type: IV induction Ventilation: Mask ventilation without difficulty Laryngoscope Size: Mac and 3 Grade View: Grade I Tube type: Oral Tube size: 7.5 mm Number of attempts: 1 Airway Equipment and Method: Stylet Placement Confirmation: ETT inserted through vocal cords under direct vision,  breath sounds checked- equal and bilateral and CO2 detector Secured at: 21 cm Tube secured with: Tape Dental Injury: Teeth and Oropharynx as per pre-operative assessment

## 2012-11-27 NOTE — Anesthesia Preprocedure Evaluation (Signed)
Anesthesia Evaluation  Patient identified by MRN, date of birth, ID band Patient awake    Reviewed: Allergy & Precautions, H&P , NPO status , Patient's Chart, lab work & pertinent test results  Airway Mallampati: I TM Distance: >3 FB Neck ROM: full    Dental   Pulmonary          Cardiovascular hypertension, + CAD and + Peripheral Vascular Disease Rhythm:regular Rate:Normal     Neuro/Psych Anxiety    GI/Hepatic hiatal hernia, GERD-  ,  Endo/Other    Renal/GU Renal disease     Musculoskeletal   Abdominal   Peds  Hematology   Anesthesia Other Findings   Reproductive/Obstetrics                           Anesthesia Physical Anesthesia Plan  ASA: III  Anesthesia Plan: General   Post-op Pain Management:    Induction: Intravenous  Airway Management Planned: Oral ETT  Additional Equipment: Arterial line  Intra-op Plan:   Post-operative Plan: Extubation in OR  Informed Consent: I have reviewed the patients History and Physical, chart, labs and discussed the procedure including the risks, benefits and alternatives for the proposed anesthesia with the patient or authorized representative who has indicated his/her understanding and acceptance.     Plan Discussed with: CRNA, Anesthesiologist and Surgeon  Anesthesia Plan Comments:         Anesthesia Quick Evaluation

## 2012-11-27 NOTE — Consult Note (Signed)
[facility] [place]  Patient name: Shirley Shea MRN: [id number] DOB: 07/13/1936 Sex: female  Chief Complaint   Patient presents with   .  Routine Post Op     s/p left CEA 10/09/2012 - pt has no complaints    HISTORY OF PRESENT ILLNESS:  The patient is back today for followup. She is status post left carotid endarterectomy for asymptomatic stenosis. Intraoperative findings included 85% stenosis. Her postoperative course was uncomplicated and she was discharged to home on postoperative day one. She has no complaints today. She did have a sore throat after her operation which has resolved. She has some numbness around her incision. She reports no weakness in her upper or lower extremity. She reports no slurred speech. She reports no trouble swallowing.  Past Medical History   Diagnosis  Date   .  Diverticulosis    .  Hyperlipidemia    .  IBS (irritable bowel syndrome)    .  Allergy    .  Carotid artery occlusion    .  Hypertension    .  Renal artery stenosis    .  CAD (coronary artery disease)    .  Anxiety    .  Hiatal hernia    .  Arthritis      hands    Past Surgical History   Procedure  Date   .  Abdominal hysterectomy    .  Rectal prolapse repair      [facility]. Dr. Mirian Mo   .  Breast surgery      implants removed   .  Eye surgery      tear duct repair bilateral   .  Endarterectomy  10/09/2012     Procedure: ENDARTERECTOMY CAROTID; Surgeon: Nada Libman, MD; Location: [facility] OR; Service: Vascular; Laterality: Left;   .  Patch angioplasty  10/09/2012     Procedure: PATCH ANGIOPLASTY; Surgeon: Nada Libman, MD; Location: [facility] OR; Service: Vascular; Laterality: Left;    History    Social History   .  Marital Status:  Divorced     Spouse Name:  N/A     Number of Children:  N/A   .  Years of Education:  N/A    Occupational History   .  Not on file.    Social History Main Topics   .  Smoking status:  Never Smoker   .  Smokeless tobacco:   Never Used   .  Alcohol Use:  1.0 oz/week     2 drink(s) per week   .  Drug Use:  No   .  Sexually Active:  Not on file    Other Topics  Concern   .  Not on file    Social History Narrative   .  No narrative on file    Family History   Problem  Relation  Age of Onset   .  Hyperlipidemia  Mother    .  Hypertension  Mother    .  Diabetes  Father     Allergies as of 10/27/2012 - Review Complete 10/27/2012   Allergen  Reaction  Noted   .  Ace inhibitors   09/15/2012   .  Codeine   09/15/2012    Current Outpatient Prescriptions on File Prior to Visit   Medication  Sig  Dispense  Refill   .  alendronate (FOSAMAX) 40 MG tablet  Take 70 mg by mouth every 7 (seven) days. Take with  a full glass of water on an empty stomach. Take on Tuesdays     .  ALPRAZolam (XANAX) 0.5 MG tablet  Take 0.5 mg by mouth daily.     Marland Kitchen  aspirin 81 MG tablet  Take 81 mg by mouth daily.     .  Calcium Carb-Cholecalciferol (CALCIUM + D3) 600-200 MG-UNIT TABS  Take 600 mg by mouth daily.     .  cycloSPORINE (RESTASIS) 0.05 % ophthalmic emulsion  Place 1 drop into both eyes at bedtime. Emulsion 1 into affected eye     .  diphenhydrAMINE (BENADRYL) 25 mg capsule  Take 25 mg by mouth every 6 (six) hours as needed. For allergies     .  escitalopram (LEXAPRO) 10 MG tablet  Take 10 mg by mouth daily.     Marland Kitchen  esomeprazole (NEXIUM) 40 MG capsule  Take 40 mg by mouth daily.     Marland Kitchen  estradiol (VIVELLE-DOT) 0.025 MG/24HR  Place 1 patch onto the skin once a week. Apply on Fridays     .  hydroxychloroquine (PLAQUENIL) 200 MG tablet  Take 200 mg by mouth daily.     .  metoprolol tartrate (LOPRESSOR) 25 MG tablet  Take 25 mg by mouth 2 (two) times daily.     .  Multiple Vitamins-Minerals (ICAPS PO)  Take 1 tablet by mouth daily.     .  niacin (NIASPAN) 500 MG CR tablet  Take 500 mg by mouth 2 (two) times daily.     Marland Kitchen  oxyCODONE (ROXICODONE) 5 MG immediate release tablet  Take 1 tablet (5 mg total) by mouth every 6 (six) hours as  needed for pain.  30 tablet  0   .  simvastatin (ZOCOR) 20 MG tablet  Take 20 mg by mouth every evening.     .  zolpidem (AMBIEN) 10 MG tablet  Take 5 mg by mouth at bedtime as needed. For sleep      REVIEW OF SYSTEMS:  No changes from previous visit  PHYSICAL EXAMINATION:  Vital signs are BP 150/60  Pulse 64  Temp 98.2 F (36.8 C) (Oral)  Resp 16  Ht 5\' 5"  (1.651 m)  Wt 125 lb (56.7 kg)  BMI 20.80 kg/m2  SpO2 100%  General: The patient appears their stated age.  HEENT: No gross abnormalities  Pulmonary: Non labored breathing  Musculoskeletal: There are no major deformities.  Neurologic: No focal weakness or paresthesias are detected,  Skin: There are no ulcer or rashes noted.  Psychiatric: The patient has normal affect.  Cardiovascular: Left carotid endarterectomy incision is well-healed.  Diagnostic Studies  None  Assessment:  Status post left carotid endarterectomy  Plan:  The patient is doing very well from her left carotid endarterectomy. She also has a high-grade right carotid stenosis. She has been scheduled for right carotid endarterectomy, to be performed: Thursday, January 9. I will have her see ENT prior to her operation to evaluate her vocal cords. She shows no evidence of vocal cord injury at this time. She does have occasional hoarseness, which is her baseline and was present before operation  V. Charlena Cross, M.D.  [facility] [place]  Office: [phone]  Pager: [phone]

## 2012-11-27 NOTE — Progress Notes (Signed)
Call to Dr. Feliberto Gottron. [facility] reviewed EKG done today & recent note fr. Dr. Eldridge Dace & notation on last echo- to repeat in one yr. due to MVR.  Pt. Denies CP, SOB, denies all changes since seeing Dr. Eldridge Dace.

## 2012-11-27 NOTE — Progress Notes (Signed)
Pt admitted to 3300 from PACU. Oriented to unit and room. Safety discussed and call bell with in reach. VSS. Pt is on dopamine gtt.  Elijah Birk, RN

## 2012-11-27 NOTE — OR Nursing (Addendum)
Patient was neuro intact at 1145 OR nursing assessment.

## 2012-11-27 NOTE — Preoperative (Signed)
Beta Blockers   Reason not to administer Beta Blockers:Lopressor taken this am 11/28/11

## 2012-11-27 NOTE — Op Note (Signed)
[facility] [place]  Patient name: Shirley Shea MRN: [id number] DOB: [birth year] Sex: female  11/27/2012 Pre-operative Diagnosis: Asymptomatic   right carotid stenosis Post-operative diagnosis:  Same Surgeon:  Jorge Ny Assistants:  M.Collins Procedure:    right carotid Endarterectomy with bovine pericardial  patch angioplasty Anesthesia:  General Blood Loss:  See anesthesia record Specimens:  Carotid Plaque to pathology  Findings:  80 %stenosis; Thrombus:  None  Indications:  Patient comes in today for a right carotid endarterectomy. She has previously undergone left carotid endarterectomy. She remained asymptomatic. She has had her vocal cords evaluated by ENT and been cleared to proceed.  Procedure:  The patient was identified in the holding area and taken to [facility] OR ROOM 11  The patient was then placed supine on the table.   General endotrachial anesthesia was administered.  The patient was prepped and draped in the usual sterile fashion.  A time out was called and antibiotics were administered.  The incision was made along the anterior border of the right sternocleidomastoid muscle.  Cautery was used to dissect through the subcutaneous tissue.  The platysma muscle was divided with cautery.  The internal jugular vein was exposed along its anterior medial border.  The common facial vein was exposed and then divided between 2-0 silk ties and metal clips.  The common carotid artery was then circumferentially exposed and encircled with an umbilical tape.  The vagus nerve was identified and protected.  Next sharp dissection was used to expose the external carotid artery and the superior thyroid artery.  The were encircled with a blue vessel loop and a 2-0 silk tie respectively.  Finally, the internal carotid was carefully dissected free.  An umbilical tape was placed around the internal carotid artery distal to the diseased segment.  The hypoglossal nerve was  visualized throughout and protected.  The patient was given systemic heparinization.  A bovine carotid patch was selected and prepared on the back table.  A 10 french shunt was also prepared.  After blood pressure readings were appropriate and the heparin had been given time to circulate, the internal carotid artery was occluded with a baby Gregory clamp.  The external and common carotid arteries were then occluded with vascular clamps and the 2-0 tie tightened on the superior thyroid artery.  A #11 blade was used to make an arteriotomy in the common carotid artery.  This was extended with Potts scissors along the anterior and lateral border of the common and internal carotid artery.  Approximately 80 % stenosis was identified.  There was no thrombus identified.  The 10 french shunt was not placed. The patient had excellent backbleeding, and I did not feel it was required.  A kleiner kuntz elevator was used to perform endarterectomy.  An eversion endarterectomy was performed in the external carotid artery.  A good distal endpoint was obtained in the internal carotid artery.  The specimen was removed and sent to pathology.  Heparinized saline was used to irrigate the endarterectomized field.  All potential embolic debris was removed.  Bovine pericardial patch angioplasty was then performed using a running 6-0 Prolene.  The common internal and external carotid arteries were all appropriately flushed. The artery was again irrigated with heparin saline.  The anastomosis was then secured. The clamp was first released on the external carotid artery followed by the common carotid artery approximately 30 seconds later, bloodflow was reestablish through the internal carotid artery.  Next, a hand-held  Doppler  was used to evaluate the signals in the common, external, and internal  carotid arteries, all of which had appropriate signals. I then administered  50 mg protamine. The wound was then irrigated.  After hemostasis  was achieved, the carotid sheath was reapproximated with 3-0 Vicryl. The  platysma muscle was reapproximated with running 3-0 Vicryl. The skin  was closed with 4-0 Vicryl. Dermabond was placed on the skin. The  patient was then successfully extubated. Her neurologic exam was  similar to his preprocedural exam. The patient was then taken to recovery room  in stable condition. There were no complications.     Disposition:  To PACU in stable condition.  Relevant Operative Details:  80% stenosis at the bifurcation. The bifurcation was in the mid neck. I did not have to visualize the hypoglossal for adequate exposure. I did not use a shunt due to excellent backbleeding.  Juleen China, M.D. [facility] [place] Office: [phone] Pager:  [phone]

## 2012-11-27 NOTE — Telephone Encounter (Addendum)
Message copied by Shari Prows on Thu Nov 27, 2012  1:35 PM ------      Message from: [place], [place] K      Created: Thu Nov 27, 2012  1:01 PM      Regarding: schedule                   ----- Message -----         From: Dara Lords, PA         Sent: 11/27/2012  12:55 PM           To: Sharee Pimple, CMA            S/p right CEA today by Dr. Myra Gianotti.  F/u with him in 2 weeks.            Thanks,      Lelon Mast  I scheduled an appt for the above pt on 12/22/12 at 8:45am w/ [facility]. I mailed an appt letter to the pt and also left a message on her hm ph#.awt

## 2012-11-27 NOTE — Transfer of Care (Signed)
Immediate Anesthesia Transfer of Care Note  Patient: Shirley Shea  Procedure(s) Performed: Procedure(s) (LRB) with comments: ENDARTERECTOMY CAROTID (Right) PATCH ANGIOPLASTY (Right) - Using Vascu- Guard 1cm x 6cm patch.  Patient Location: PACU  Anesthesia Type:General  Level of Consciousness: awake and patient cooperative  Airway & Oxygen Therapy: Face mask   Post-op Assessment: Report given to PACU RN, Post -op Vital signs reviewed and stable and Patient moving all extremities X 4  Post vital signs: Reviewed and stable tongue midline   Complications: No apparent anesthesia complications

## 2012-11-27 NOTE — Progress Notes (Signed)
Utilization review completed.  

## 2012-11-28 ENCOUNTER — Encounter (HOSPITAL_COMMUNITY): Payer: Self-pay | Admitting: Surgery

## 2012-11-28 LAB — CBC
HCT: 32.3 % — ABNORMAL LOW (ref 36.0–46.0)
Hemoglobin: 10.5 g/dL — ABNORMAL LOW (ref 12.0–15.0)
MCH: 28.9 pg (ref 26.0–34.0)
MCV: 89 fL (ref 78.0–100.0)
Platelets: 177 10*3/uL (ref 150–400)
RBC: 3.63 MIL/uL — ABNORMAL LOW (ref 3.87–5.11)
WBC: 7.7 10*3/uL (ref 4.0–10.5)

## 2012-11-28 LAB — BASIC METABOLIC PANEL
BUN: 8 mg/dL (ref 6–23)
CO2: 23 mEq/L (ref 19–32)
Calcium: 8.5 mg/dL (ref 8.4–10.5)
Chloride: 107 mEq/L (ref 96–112)
Creatinine, Ser: 0.91 mg/dL (ref 0.50–1.10)
Glucose, Bld: 103 mg/dL — ABNORMAL HIGH (ref 70–99)

## 2012-11-28 MED ORDER — SODIUM CHLORIDE 0.9 % IV BOLUS (SEPSIS)
500.0000 mL | Freq: Once | INTRAVENOUS | Status: AC
  Administered 2012-11-28: 500 mL via INTRAVENOUS

## 2012-11-28 MED ORDER — OXYCODONE-ACETAMINOPHEN 5-325 MG PO TABS: 1.0000 | ORAL_TABLET | ORAL | Status: DC | PRN

## 2012-11-28 NOTE — Progress Notes (Signed)
VASCULAR AND VEIN SURGERY POST - OP CEA PROGRESS NOTE  Date of Surgery: 11/27/2012  Surgeon(s): Nada Libman, MD 1 Day Post-Op right Carotid Endarterectomy .  HPI: Shirley Shea is a 77 y.o. female who is 1 Day Post-Op right Carotid Endarterectomy . Patient is doing well. Pre-operative symptoms are Improved Patient denies headache; Patient denies difficulty swallowing; denies weakness in upper or lower extremities; Pt. denies other symptoms of stroke or TIA.  IMAGING: No results found.  Significant Diagnostic Studies: CBC Lab Results  Component Value Date   WBC 7.7 11/28/2012   HGB 10.5* 11/28/2012   HCT 32.3* 11/28/2012   MCV 89.0 11/28/2012   PLT 177 11/28/2012    BMET    Component Value Date/Time   NA 141 11/28/2012 0430   K 3.9 11/28/2012 0430   CL 107 11/28/2012 0430   CO2 23 11/28/2012 0430   GLUCOSE 103* 11/28/2012 0430   BUN 8 11/28/2012 0430   CREATININE 0.91 11/28/2012 0430   CALCIUM 8.5 11/28/2012 0430   GFRNONAA 61* 11/28/2012 0430   GFRAA 70* 11/28/2012 0430    COAG Lab Results  Component Value Date   INR 0.99 11/21/2012   No results found for this basename: PTT      Intake/Output Summary (Last 24 hours) at 11/28/12 0743 Last data filed at 11/27/12 2017  Gross per 24 hour  Intake 1437.4 ml  Output    750 ml  Net  687.4 ml    Physical Exam:  BP Readings from Last 3 Encounters:  11/28/12 132/40  11/28/12 132/40  11/21/12 101/71   Temp Readings from Last 3 Encounters:  11/28/12 99.6 F (37.6 C) Oral  11/28/12 99.6 F (37.6 C) Oral  11/21/12 98.3 F (36.8 C)    SpO2 Readings from Last 3 Encounters:  11/28/12 97%  11/28/12 97%  11/21/12 97%   Pulse Readings from Last 3 Encounters:  11/28/12 68  11/28/12 68  11/21/12 52    Pt is A&O x 3 Gait is normal Speech is fluent right Neck Wound is healing well Patient with Negative tongue deviation and Negative facial droop Pt has good and equal strength in all extremities  Assessment/Plan::  Shirley Shea is a 77 y.o. female is S/P Right Carotid endarterectomy Pt is voiding, ambulating and taking po well    Discharge to: Home Follow-up in 2 weeks hep lock IV wean off dopamine  Mosetta Pigeon [phone] 11/28/2012 7:43 AM

## 2012-11-28 NOTE — Progress Notes (Signed)
Pt. ambulated 1 lap around the unit.  Tolerated ambulation well. Pt. denies sob, dizziness, and discomfort. Pt. Stated "legs feel a little weak" while ambulating.   Pt agreed to walk intermittently with assistance to regain leg strength. RN notified of findings.

## 2012-11-28 NOTE — Progress Notes (Signed)
Spoke with Shirley Shea with vascular. Received order to give patient 500cc bolus of NS. After bolus patient can go home with family. Bolus is running now. Will continue to monitor.

## 2012-11-28 NOTE — Progress Notes (Signed)
Gave 500cc NS bolus per order. Last bp =112/42. Pt has no complaints of pain or dizziness.Discussed discharge instructions and medications with patient and daughter. Both verbalized understanding with all questions answered. VSS. MD order to discharge pt home. Pt dc'ed home with daughter.  [facility]

## 2012-11-28 NOTE — Discharge Summary (Signed)
[facility] Discharge Summary   Patient ID:  Shirley Shea MRN: [id number] DOB/AGE: 01-31-1936 77 y.o.  Admit date: 11/27/2012 Discharge date: 11/28/2012 Date of Surgery: 11/27/2012 Surgeon: Surgeon(s): Nada Libman, MD  Admission Diagnosis: Right Internal Carotid Artery Stenosis  Discharge Diagnoses:  Right Internal Carotid Artery Stenosis  Secondary Diagnoses: Past Medical History  Diagnosis Date  . Diverticulosis   . Hyperlipidemia   . IBS (irritable bowel syndrome)   . Allergy   . Carotid artery occlusion   . Hypertension   . Renal artery stenosis   . CAD (coronary artery disease)   . Anxiety   . Hiatal hernia   . Arthritis     hands  . GERD (gastroesophageal reflux disease)     Procedure(s): ENDARTERECTOMY CAROTID PATCH ANGIOPLASTY  Discharged Condition: good  HPI: The patient is back today for followup. She is status post left carotid endarterectomy for asymptomatic stenosis. Intraoperative findings included 85% stenosis. Her postoperative course was uncomplicated and she was discharged to home on postoperative day one. She has no complaints today. She did have a sore throat after her operation which has resolved. She has some numbness around her incision. She reports no weakness in her upper or lower extremity. She reports no slurred speech. She reports no trouble swallowing.     Hospital Course:  Shirley Shea is a 77 y.o. female is S/P Right Procedure(s): ENDARTERECTOMY CAROTID PATCH ANGIOPLASTY Extubated: POD # 0 Post-op wounds healing well Pt. Ambulating, voiding and taking PO diet without difficulty. Pt pain controlled with PO pain meds. Labs as below Complications:hypotention  Consults:     Significant Diagnostic Studies: CBC Lab Results  Component Value Date   WBC 7.7 11/28/2012   HGB 10.5* 11/28/2012   HCT 32.3* 11/28/2012   MCV 89.0 11/28/2012   PLT 177 11/28/2012    BMET    Component Value Date/Time   NA 141  11/28/2012 0430   K 3.9 11/28/2012 0430   CL 107 11/28/2012 0430   CO2 23 11/28/2012 0430   GLUCOSE 103* 11/28/2012 0430   BUN 8 11/28/2012 0430   CREATININE 0.91 11/28/2012 0430   CALCIUM 8.5 11/28/2012 0430   GFRNONAA 61* 11/28/2012 0430   GFRAA 70* 11/28/2012 0430   COAG Lab Results  Component Value Date   INR 0.99 11/21/2012     Disposition:  Discharge to :Home Discharge Orders    Future Appointments: Provider: Department: Dept Phone: Center:   12/22/2012 8:45 AM Nada Libman, MD [facility] [phone] [facility]     Future Orders Please Complete By Expires   Resume previous diet      Driving Restrictions      Comments:   No driving for 2 weeks   Lifting restrictions      Comments:   No lifting for 4 weeks   Call MD for:  temperature >100.5      Call MD for:  redness, tenderness, or signs of infection (pain, swelling, bleeding, redness, odor or green/yellow discharge around incision site)      Call MD for:  severe or increased pain, loss or decreased feeling  in affected limb(s)      CAROTID Sugery: Call MD for difficulty swallowing or speaking; weakness in arms or legs that is a new symtom; severe headache.  If you have increased swelling in the neck and/or  are having difficulty breathing, CALL 911      Discharge wound care:  Comments:   Shower daily with soap and water starting 11/29/12      [place], [place]  Home Medication Instructions UJW:[id number]   Printed on:11/28/12 0751  Medication Information                    esomeprazole (NEXIUM) 40 MG capsule Take 40 mg by mouth daily.            zolpidem (AMBIEN) 10 MG tablet Take 5 mg by mouth at bedtime as needed. For sleep           hydroxychloroquine (PLAQUENIL) 200 MG tablet Take 200 mg by mouth daily.           estradiol (VIVELLE-DOT) 0.025 MG/24HR Place 1 patch onto the skin once a week. Apply on Fridays           niacin (NIASPAN) 500 MG CR tablet Take 500 mg by mouth 2 (two)  times daily.            aspirin 81 MG tablet Take 81 mg by mouth daily.           simvastatin (ZOCOR) 20 MG tablet Take 20 mg by mouth every evening.           ALPRAZolam (XANAX) 0.5 MG tablet Take 0.5 mg by mouth daily.            cycloSPORINE (RESTASIS) 0.05 % ophthalmic emulsion Place 1 drop into both eyes at bedtime. Emulsion 1 into affected eye           Calcium Carb-Cholecalciferol (CALCIUM + D3) 600-200 MG-UNIT TABS Take 1 tablet by mouth daily.            metoprolol tartrate (LOPRESSOR) 25 MG tablet Take 25 mg by mouth 2 (two) times daily.           Multiple Vitamins-Minerals (ICAPS PO) Take 1 tablet by mouth daily.           escitalopram (LEXAPRO) 10 MG tablet Take 10 mg by mouth at bedtime.            diphenhydrAMINE (BENADRYL) 25 mg capsule Take 25 mg by mouth every 6 (six) hours as needed. For allergies           alendronate (FOSAMAX) 70 MG tablet Take 70 mg by mouth every 7 (seven) days. Take with a full glass of water on an empty stomach. Take on Tuesday           oxyCODONE-acetaminophen (PERCOCET/ROXICET) 5-325 MG per tablet Take 1-2 tablets by mouth every 4 (four) hours as needed.            Verbal and written Discharge instructions given to the patient. Wound care per Discharge AVS Follow-up Information    Follow up with Myra Gianotti IV, Lala Lund, MD. In 2 weeks. (Office will call you to schedule your appt (sent0)    Contact information:   [address] [place] [address] [phone]        D/C home once off dopamine and BP is stable.  SignedMosetta Pigeon 11/28/2012, 7:51 AM

## 2012-11-28 NOTE — Anesthesia Postprocedure Evaluation (Signed)
  Anesthesia Post-op Note  Patient: Shirley Shea  Procedure(s) Performed: Procedure(s) (LRB) with comments: ENDARTERECTOMY CAROTID (Right) PATCH ANGIOPLASTY (Right) - Using Vascu- Guard 1cm x 6cm patch.  Patient Location: PACU  Anesthesia Type:General  Level of Consciousness: awake, oriented, sedated and patient cooperative  Airway and Oxygen Therapy: Patient Spontanous Breathing  Post-op Pain: mild  Post-op Assessment: Post-op Vital signs reviewed, Patient's Cardiovascular Status Stable, Respiratory Function Stable, Patent Airway, No signs of Nausea or vomiting and Pain level controlled  Post-op Vital Signs: stable  Complications: No apparent anesthesia complications

## 2012-12-01 NOTE — H&P (Signed)
Note Time: 11/27/12 1013             [facility] [place]   Patient name: Shirley Shea MRN: [id number] DOB: May 16, 1936 Sex: female   Chief Complaint    Patient presents with    .   Routine Post Op        s/p left CEA 10/09/2012 - pt has no complaints       HISTORY OF PRESENT ILLNESS:   The patient is back today for followup. She is status post left carotid endarterectomy for asymptomatic stenosis. Intraoperative findings included 85% stenosis. Her postoperative course was uncomplicated and she was discharged to home on postoperative day one. She has no complaints today. She did have a sore throat after her operation which has resolved. She has some numbness around her incision. She reports no weakness in her upper or lower extremity. She reports no slurred speech. She reports no trouble swallowing.   Past Medical History    Diagnosis   Date    .   Diverticulosis      .   Hyperlipidemia      .   IBS (irritable bowel syndrome)      .   Allergy      .   Carotid artery occlusion      .   Hypertension      .   Renal artery stenosis      .   CAD (coronary artery disease)      .   Anxiety      .   Hiatal hernia      .   Arthritis          hands        Past Surgical History    Procedure   Date    .   Abdominal hysterectomy      .   Rectal prolapse repair          [facility]. Dr. Mirian Mo    .   Breast surgery          implants removed    .   Eye surgery          tear duct repair bilateral    .   Endarterectomy   10/09/2012        Procedure: ENDARTERECTOMY CAROTID; Surgeon: Nada Libman, MD; Location: [facility] OR; Service: Vascular; Laterality: Left;    .   Patch angioplasty   10/09/2012        Procedure: PATCH ANGIOPLASTY; Surgeon: Nada Libman, MD; Location: [facility] OR; Service: Vascular; Laterality: Left;        History        Social History    .   Marital Status:   Divorced        Spouse Name:   N/A        Number of Children:   N/A    .   Years  of Education:   N/A        Occupational History    .   Not on file.        Social History Main Topics    .   Smoking status:   Never Smoker    .   Smokeless tobacco:   Never Used    .   Alcohol Use:   1.0 oz/week        2 drink(s) per week    .   Drug Use:  No    .   Sexually Active:   Not on file        Other Topics   Concern    .   Not on file        Social History Narrative    .   No narrative on file        Family History    Problem   Relation   Age of Onset    .   Hyperlipidemia   Mother      .   Hypertension   Mother      .   Diabetes   Father          Allergies as of 10/27/2012 - Review Complete 10/27/2012    Allergen   Reaction   Noted    .   Ace inhibitors     09/15/2012    .   Codeine     09/15/2012        Current Outpatient Prescriptions on File Prior to Visit    Medication   Sig   Dispense   Refill    .   alendronate (FOSAMAX) 40 MG tablet   Take 70 mg by mouth every 7 (seven) days. Take with a full glass of water on an empty stomach. Take on Tuesdays        .   ALPRAZolam (XANAX) 0.5 MG tablet   Take 0.5 mg by mouth daily.        Marland Kitchen   aspirin 81 MG tablet   Take 81 mg by mouth daily.        .   Calcium Carb-Cholecalciferol (CALCIUM + D3) 600-200 MG-UNIT TABS   Take 600 mg by mouth daily.        .   cycloSPORINE (RESTASIS) 0.05 % ophthalmic emulsion   Place 1 drop into both eyes at bedtime. Emulsion 1 into affected eye        .   diphenhydrAMINE (BENADRYL) 25 mg capsule   Take 25 mg by mouth every 6 (six) hours as needed. For allergies        .   escitalopram (LEXAPRO) 10 MG tablet   Take 10 mg by mouth daily.        Marland Kitchen   esomeprazole (NEXIUM) 40 MG capsule   Take 40 mg by mouth daily.        Marland Kitchen   estradiol (VIVELLE-DOT) 0.025 MG/24HR   Place 1 patch onto the skin once a week. Apply on Fridays        .   hydroxychloroquine (PLAQUENIL) 200 MG tablet   Take 200 mg by mouth daily.        .   metoprolol tartrate (LOPRESSOR) 25 MG tablet   Take 25 mg by mouth 2  (two) times daily.        .   Multiple Vitamins-Minerals (ICAPS PO)   Take 1 tablet by mouth daily.        .   niacin (NIASPAN) 500 MG CR tablet   Take 500 mg by mouth 2 (two) times daily.        Marland Kitchen   oxyCODONE (ROXICODONE) 5 MG immediate release tablet   Take 1 tablet (5 mg total) by mouth every 6 (six) hours as needed for pain.   30 tablet   0    .   simvastatin (ZOCOR) 20 MG tablet   Take 20 mg by mouth every evening.        Marland Kitchen  zolpidem (AMBIEN) 10 MG tablet   Take 5 mg by mouth at bedtime as needed. For sleep           REVIEW OF SYSTEMS:   No changes from previous visit   PHYSICAL EXAMINATION:   Vital signs are BP 150/60  Pulse 64  Temp 98.2 F (36.8 C) (Oral)  Resp 16  Ht 5\' 5"  (1.651 m)  Wt 125 lb (56.7 kg)  BMI 20.80 kg/m2  SpO2 100%   General: The patient appears their stated age.   HEENT: No gross abnormalities   Pulmonary: Non labored breathing   Musculoskeletal: There are no major deformities.   Neurologic: No focal weakness or paresthesias are detected,   Skin: There are no ulcer or rashes noted.   Psychiatric: The patient has normal affect.   Cardiovascular: Left carotid endarterectomy incision is well-healed.   Diagnostic Studies   None   Assessment:   Status post left carotid endarterectomy   Plan:   The patient is doing very well from her left carotid endarterectomy. She also has a high-grade right carotid stenosis. She has been scheduled for right carotid endarterectomy, to be performed: Thursday, January 9. I will have her see ENT prior to her operation to evaluate her vocal cords. She shows no evidence of vocal cord injury at this time. She does have occasional hoarseness, which is her baseline and was present before operation   V. Charlena Cross, M.D.   [facility] [place]   Office: [phone]   Pager: [phone]

## 2012-12-08 ENCOUNTER — Ambulatory Visit: Payer: Medicare Other | Admitting: Surgery

## 2012-12-11 NOTE — Discharge Summary (Signed)
I agree with the above  Shirley Shea 

## 2012-12-19 ENCOUNTER — Encounter: Payer: Self-pay | Admitting: Surgery

## 2012-12-22 ENCOUNTER — Ambulatory Visit (INDEPENDENT_AMBULATORY_CARE_PROVIDER_SITE_OTHER): Payer: Medicare Other | Admitting: Surgery

## 2012-12-22 ENCOUNTER — Other Ambulatory Visit: Payer: Self-pay | Admitting: *Deleted

## 2012-12-22 ENCOUNTER — Encounter: Payer: Self-pay | Admitting: Surgery

## 2012-12-22 VITALS — BP 189/66 | HR 65 | Temp 98.2°F | Ht 65.0 in | Wt 123.1 lb

## 2012-12-22 DIAGNOSIS — Z48812 Encounter for surgical aftercare following surgery on the circulatory system: Secondary | ICD-10-CM

## 2012-12-22 DIAGNOSIS — I6529 Occlusion and stenosis of unspecified carotid artery: Secondary | ICD-10-CM

## 2012-12-22 NOTE — Progress Notes (Signed)
The patient is back today for followup. She is status post bilateral, staged carotid endarterectomy. The most recent of which was on 11/27/2012. This was a right carotid endarterectomy with patch angioplasty. Intraoperative findings revealed 80% stenosis. At baseline she has hoarseness. She was evaluated by ENT and found to have normal functioning vocal cords but a thin larynx.  She has no complaints today. She denies numbness or weakness in either extremity. She denies slurred speech. She denies amaurosis fugax.  Physical examination: Her incision is healing nicely. She remains neurologically intact.  Overall she is done extremely well. I will have her followup in 6 months for a carotid ultrasound.

## 2013-06-19 ENCOUNTER — Encounter: Payer: Self-pay | Admitting: Surgery

## 2013-06-22 ENCOUNTER — Ambulatory Visit (INDEPENDENT_AMBULATORY_CARE_PROVIDER_SITE_OTHER): Payer: Medicare Other | Admitting: Surgery

## 2013-06-22 ENCOUNTER — Encounter: Payer: Self-pay | Admitting: Surgery

## 2013-06-22 ENCOUNTER — Other Ambulatory Visit (INDEPENDENT_AMBULATORY_CARE_PROVIDER_SITE_OTHER): Payer: Medicare Other | Admitting: *Deleted

## 2013-06-22 DIAGNOSIS — Z48812 Encounter for surgical aftercare following surgery on the circulatory system: Secondary | ICD-10-CM

## 2013-06-22 DIAGNOSIS — I6529 Occlusion and stenosis of unspecified carotid artery: Secondary | ICD-10-CM

## 2013-06-22 NOTE — Progress Notes (Signed)
[facility] [place]   Patient name: Shirley Shea MRN: [id number] DOB: July 31, 1936 Sex: female     Chief Complaint  Patient presents with  . Carotid    6 month carotid f/u  s/p right carotid endarterectomy 11/27/2012 and left CEA  09/2012  LAB STUDY PRIOR    HISTORY OF PRESENT ILLNESS: The patient is back today for followup. She is status post left carotid endarterectomy in November 2013 and right carotid endarterectomy in January of 2014. Both were done for asymptomatic high-grade stenosis. The patient has done well from her operation. She was seen in between the 2 procedures by the nose and throat is that she had weak but functioning vocal cords. Complaint today is that of some numbness around her right carotid incision.  Past Medical History  Diagnosis Date  . Diverticulosis   . Hyperlipidemia   . IBS (irritable bowel syndrome)   . Allergy   . Carotid artery occlusion   . Hypertension   . Renal artery stenosis   . CAD (coronary artery disease)   . Anxiety   . Hiatal hernia   . Arthritis     hands  . GERD (gastroesophageal reflux disease)     Past Surgical History  Procedure Laterality Date  . Abdominal hysterectomy    . Rectal prolapse repair      [facility]. Dr. Mirian Mo  . Breast surgery      implants removed  . Eye surgery      tear duct repair   bilateral  . Endarterectomy  10/09/2012    Procedure: ENDARTERECTOMY CAROTID;  Surgeon: Nada Libman, MD;  Location: [facility] OR;  Service: Vascular;  Laterality: Left;  . Patch angioplasty  10/09/2012    Procedure: PATCH ANGIOPLASTY;  Surgeon: Nada Libman, MD;  Location: [facility] OR;  Service: Vascular;  Laterality: Left;  . Endarterectomy  11/27/2012    Procedure: ENDARTERECTOMY CAROTID;  Surgeon: Nada Libman, MD;  Location: [facility] OR;  Service: Vascular;  Laterality: Right;  . Patch angioplasty  11/27/2012    Procedure: PATCH ANGIOPLASTY;  Surgeon: Nada Libman, MD;  Location: [facility] OR;  Service:  Vascular;  Laterality: Right;  Using Vascu- Guard 1cm x 6cm patch.    History   Social History  . Marital Status: Divorced    Spouse Name: N/A    Number of Children: N/A  . Years of Education: N/A   Occupational History  . Not on file.   Social History Main Topics  . Smoking status: Current Every Day Smoker    Types: Cigarettes  . Smokeless tobacco: Never Used     Comment: couple oz wine daily  . Alcohol Use: 5.2 oz/week    2 Drinks containing 0.5 oz of alcohol, 7 Glasses of wine per week  . Drug Use: No  . Sexually Active: Not on file   Other Topics Concern  . Not on file   Social History Narrative  . No narrative on file    Family History  Problem Relation Age of Onset  . Hyperlipidemia Mother   . Hypertension Mother   . Diabetes Father   . COPD Father     Allergies as of 06/22/2013 - Review Complete 06/22/2013  Allergen Reaction Noted  . Ace inhibitors  09/15/2012  . Codeine  09/15/2012    Current Outpatient Prescriptions on File Prior to Visit  Medication Sig Dispense Refill  . alendronate (FOSAMAX) 70 MG tablet Take 70 mg by mouth every  7 (seven) days. Take with a full glass of water on an empty stomach. Take on Tuesday      . ALPRAZolam (XANAX) 0.5 MG tablet Take 0.5 mg by mouth daily.       Marland Kitchen aspirin 81 MG tablet Take 81 mg by mouth daily.      . Calcium Carb-Cholecalciferol (CALCIUM + D3) 600-200 MG-UNIT TABS Take 1 tablet by mouth daily.       . cycloSPORINE (RESTASIS) 0.05 % ophthalmic emulsion Place 1 drop into both eyes at bedtime. Emulsion 1 into affected eye      . diphenhydrAMINE (BENADRYL) 25 mg capsule Take 25 mg by mouth every 6 (six) hours as needed. For allergies      . esomeprazole (NEXIUM) 40 MG capsule Take 40 mg by mouth daily.       Marland Kitchen estradiol (VIVELLE-DOT) 0.025 MG/24HR Place 1 patch onto the skin once a week. Apply on Fridays      . hydroxychloroquine (PLAQUENIL) 200 MG tablet Take 200 mg by mouth daily.      . metoprolol tartrate  (LOPRESSOR) 25 MG tablet Take 25 mg by mouth 2 (two) times daily.      . Multiple Vitamins-Minerals (ICAPS PO) Take 1 tablet by mouth daily.      . niacin (NIASPAN) 500 MG CR tablet Take 500 mg by mouth 2 (two) times daily.       Marland Kitchen oxyCODONE-acetaminophen (PERCOCET/ROXICET) 5-325 MG per tablet Take 1-2 tablets by mouth every 4 (four) hours as needed.  30 tablet  0  . simvastatin (ZOCOR) 20 MG tablet Take 20 mg by mouth every evening.      . zolpidem (AMBIEN) 10 MG tablet Take 5 mg by mouth at bedtime as needed. For sleep      . escitalopram (LEXAPRO) 10 MG tablet Take 10 mg by mouth at bedtime.        No current facility-administered medications on file prior to visit.     REVIEW OF SYSTEMS: Cardiovascular: No chest pain, chest pressure, palpitations, orthopnea, or dyspnea on exertion. No claudication or rest pain,  No history of DVT or phlebitis. Pulmonary: No productive cough, asthma or wheezing. Neurologic: No weakness, paresthesias, aphasia, or amaurosis. No dizziness. Hematologic: No bleeding problems or clotting disorders. Musculoskeletal: No joint pain or joint swelling. Gastrointestinal: No blood in stool or hematemesis Genitourinary: No dysuria or hematuria. Psychiatric:: No history of major depression. Integumentary: No rashes or ulcers. Constitutional: No fever or chills.  PHYSICAL EXAMINATION:   Vital signs are BP 121/51  Pulse 51  Resp 18  Ht 5\' 5"  (1.651 m)  Wt 132 lb (59.875 kg)  BMI 21.97 kg/m2 General: The patient appears their stated age. HEENT:  No gross abnormalities Pulmonary:  Non labored breathing Musculoskeletal: There are no major deformities. Neurologic: No focal weakness  Skin: There are no ulcer or rashes noted. Psychiatric: The patient has normal affect. Cardiovascular: There is a regular rate and rhythm without significant murmur appreciated. Right carotid bruit   Diagnostic Studies Carotid duplex was ordered and reviewed Widely patent carotid  endarterectomy site bilaterally with right external carotid stenosis  Assessment: Status post bilateral carotid endarterectomy Plan: Overall, the patient is doing very well. She will continue with yearly surveillance ultrasound of her carotid endarterectomy site.  VASCULAR QUALITY INITIATIVE FOLLOW UP DATA:  Current smoker: [  ] yes  [ x ] no  Living status: [ x ]  Home  [  ] Nursing home  [  ]  Homeless    MEDS:  ASA [x  ] yes  [  ] no- [  ] medical reason  [  ] non compliant  STATIN  [ x ] yes  [  ] no- [  ] medical reason  [  ] non compliant  Beta blocker [x  ] yes  [  ] no- [  ] medical reason  [  ] non compliant  ACE inhibitor [  ] yes  [x  ] no- [ x ] medical reason  [  ] non compliant  P2Y12 Antagonist [ x ] none  [  ] clopidogrel-Plavix  [  ] ticlopidine-Ticlid   [  ] prasugrel-Effient  [  ] ticagrelor- Brilinta    Anticoagulant [x  ] None  [  ] warfarin  [  ] rivaroxaban-Xarelto [  ] dabigatran- Pradaxa  Neurologic event since D/C:  [  x] no  [  ] yes: [  ] eye event  [  ] cortical event  [  ] VB event  [  ] non specific event  [  ] right  [  ] left  [  ] TIA  [  ] stroke  Date:   Modified Rankin Score: 0  MI since D/C: [ x ] no  [  ] troponin only  [  ] EKG or clinical  Cranial nerve injury: [x  ] none  [  ] resolved  [  ] persistent    CEA site re-operation:  [x  ] no   [  ] yes- date of re-op:  CEA site PCI:   [ x ] no   [  ] yes- date of PCI:     V. Charlena Cross, M.D. [facility] [place] Office: [phone] Pager:  [phone]

## 2013-06-22 NOTE — Addendum Note (Signed)
Addended by: Adria Dill L on: 06/22/2013 02:57 PM   Modules accepted: Orders

## 2013-06-24 ENCOUNTER — Other Ambulatory Visit: Payer: Self-pay

## 2013-09-08 ENCOUNTER — Encounter: Payer: Self-pay | Admitting: Interventional Cardiology

## 2013-09-08 ENCOUNTER — Ambulatory Visit (INDEPENDENT_AMBULATORY_CARE_PROVIDER_SITE_OTHER): Payer: Medicare Other | Admitting: Interventional Cardiology

## 2013-09-08 VITALS — BP 112/70 | HR 64 | Ht 65.0 in | Wt 132.4 lb

## 2013-09-08 DIAGNOSIS — I498 Other specified cardiac arrhythmias: Secondary | ICD-10-CM | POA: Insufficient documentation

## 2013-09-08 DIAGNOSIS — I491 Atrial premature depolarization: Secondary | ICD-10-CM | POA: Insufficient documentation

## 2013-09-08 DIAGNOSIS — I701 Atherosclerosis of renal artery: Secondary | ICD-10-CM | POA: Insufficient documentation

## 2013-09-08 DIAGNOSIS — F17200 Nicotine dependence, unspecified, uncomplicated: Secondary | ICD-10-CM

## 2013-09-08 DIAGNOSIS — I10 Essential (primary) hypertension: Secondary | ICD-10-CM

## 2013-09-08 NOTE — Patient Instructions (Addendum)
Your physician wants you to follow-up in: 1 year follow up with Dr. Varanasi. You will receive a reminder letter in the mail two months in advance. If you don't receive a letter, please call our office to schedule the follow-up appointment.  Your physician recommends that you continue on your current medications as directed. Please refer to the Current Medication list given to you today.  

## 2013-09-08 NOTE — Progress Notes (Signed)
Patient ID: Shirley Shea, female   DOB: 02/09/1936, 77 y.o.   MRN: [id number]    [address] [place], [place]  [address] Phone: [phone] Fax:  [phone]  Date:  09/08/2013   ID:  Shirley, Shea Jan 21, 1936, MRN [id number]  PCP:  Henri Medal, MD      History of Present Illness: Shirley Shea is a 77 y.o. female who has had HTN and renal artery stenosis. BPs at home range from 100-140 systolic. SHe walks some at home. She goes up and down stairs without problems. No CP or SHOB. She has some lightheadedness with systolics at the extreme ends of the range. No extreme blood pressures of late. No recent syncope.  Last year, she had bilateral carotid stenosis diagnosed and had bilateral CEA.  She did very well. BP has been controlled since starting Norvasc. No CP, SHOB.  She walks a little for exercise.  She feels her stamina is a little decreased.     A routine visit with her primary care doctor, she was told she had an irregular heartbeat.  Holter monitor was recommended that she opted to come see [place] first. She's not had any palpitations, lightheadedness or syncope recently, as long as her blood pressure is controlled.    Wt Readings from Last 3 Encounters:  09/08/13 132 lb 6.4 oz (60.056 kg)  06/22/13 132 lb (59.875 kg)  12/22/12 123 lb 1.6 oz (55.838 kg)     Past Medical History  Diagnosis Date  . Diverticulosis   . Hyperlipidemia   . IBS (irritable bowel syndrome)   . Allergy   . Carotid artery occlusion   . Hypertension   . Renal artery stenosis   . CAD (coronary artery disease)   . Anxiety   . Hiatal hernia   . Arthritis     hands  . GERD (gastroesophageal reflux disease)     Current Outpatient Prescriptions  Medication Sig Dispense Refill  . alendronate (FOSAMAX) 70 MG tablet Take 70 mg by mouth every 7 (seven) days. Take with a full glass of water on an empty stomach. Take on Tuesday      . ALPRAZolam (XANAX) 0.5 MG tablet Take 0.5  mg by mouth daily.       Marland Kitchen amLODipine (NORVASC) 5 MG tablet Take 5 mg by mouth daily.      Marland Kitchen aspirin 81 MG tablet Take 81 mg by mouth daily.      . Calcium Carb-Cholecalciferol (CALCIUM + D3) 600-200 MG-UNIT TABS Take 1 tablet by mouth daily.       . cycloSPORINE (RESTASIS) 0.05 % ophthalmic emulsion Place 1 drop into both eyes at bedtime. Emulsion 1 into affected eye      . diphenhydrAMINE (BENADRYL) 25 mg capsule Take 25 mg by mouth every 6 (six) hours as needed. For allergies      . escitalopram (LEXAPRO) 10 MG tablet Take 10 mg by mouth at bedtime.       Marland Kitchen esomeprazole (NEXIUM) 40 MG capsule Take 40 mg by mouth daily.       . hydroxychloroquine (PLAQUENIL) 200 MG tablet Take 200 mg by mouth daily.      . metoprolol tartrate (LOPRESSOR) 25 MG tablet Take 25 mg by mouth 2 (two) times daily.      . Multiple Vitamins-Minerals (ICAPS PO) Take 1 tablet by mouth 2 (two) times daily.       . niacin (NIASPAN) 500 MG CR tablet Take  500 mg by mouth at bedtime.       . simvastatin (ZOCOR) 20 MG tablet Take 20 mg by mouth every evening.      . zolpidem (AMBIEN) 10 MG tablet Take 5 mg by mouth at bedtime as needed. For sleep       No current facility-administered medications for this visit.    Allergies:    Allergies  Allergen Reactions  . Ace Inhibitors     cough  . Codeine     crazy    Social History:  The patient  reports that she has been smoking Cigarettes.  She has been smoking about 0.00 packs per day. She has never used smokeless tobacco. She reports that she drinks about 5.2 ounces of alcohol per week. She reports that she does not use illicit drugs.   Family History:  The patient's family history includes COPD in her father; Diabetes in her father; Hyperlipidemia in her mother; Hypertension in her mother.   ROS:  Please see the history of present illness.  No nausea, vomiting.  No fevers, chills.  No focal weakness.  No dysuria.    All other systems reviewed and negative.   PHYSICAL  EXAM: VS:  BP 112/70  Pulse 64  Ht 5\' 5"  (1.651 m)  Wt 132 lb 6.4 oz (60.056 kg)  BMI 22.03 kg/m2 Well nourished, well developed, in no acute distress HEENT: normal Neck: no JVD, no carotid bruits Cardiac:  normal S1, S2; RRR;  Lungs:  clear to auscultation bilaterally, no wheezing, rhonchi or rales Abd: soft, nontender, no hepatomegaly Ext: no edema Skin: warm and dry Neuro:   no focal abnormalities noted  EKG:  Normal sinus rhythm, PACs, no ST segment changes     ASSESSMENT AND PLAN:  Renal artery stenosis  Diagnostic Imaging:EC Renal Duplex Scan (Ordered for 09/09/2012)  Renal function and blood pressure have been stable per her report. Would not plan for angiogram at this time. Creatinine followed by PMD. 10/14 LDL 86, TG 66, HDL 73. 2. Essential hypertension, benign  Continue Metoprolol Tartrate Tablet, 25 MG, 1 tablet, Orally, Twice a day BP improved since starting Norvasc.   Arrhythmia:  Will try to get record of ECG from Dr. Ludwig Clarks to see what the rhythm issue was, prior to setting up monitor.  AFib would be the most concerning possibility, but she has not had any palpitations.   We were able to get the ECG from Dr. Jacqulyn Bath office.  I don't think this rhythm represents atrial fibrillation.  If she develops any symptoms, could consider event monitor.    Signed, Fredric Mare, MD, [facility] 09/08/2013 10:06 AM

## 2013-09-24 ENCOUNTER — Other Ambulatory Visit: Payer: Self-pay

## 2013-10-30 IMAGING — CR DG CERVICAL SPINE 1V
1 series · 1 of 1 positions shown · non-contrast
Comparison: None.

CLINICAL DATA: Incorrect needle count, post carotid surgery

DG CERVICAL SPINE - 1 VIEW

[AP]
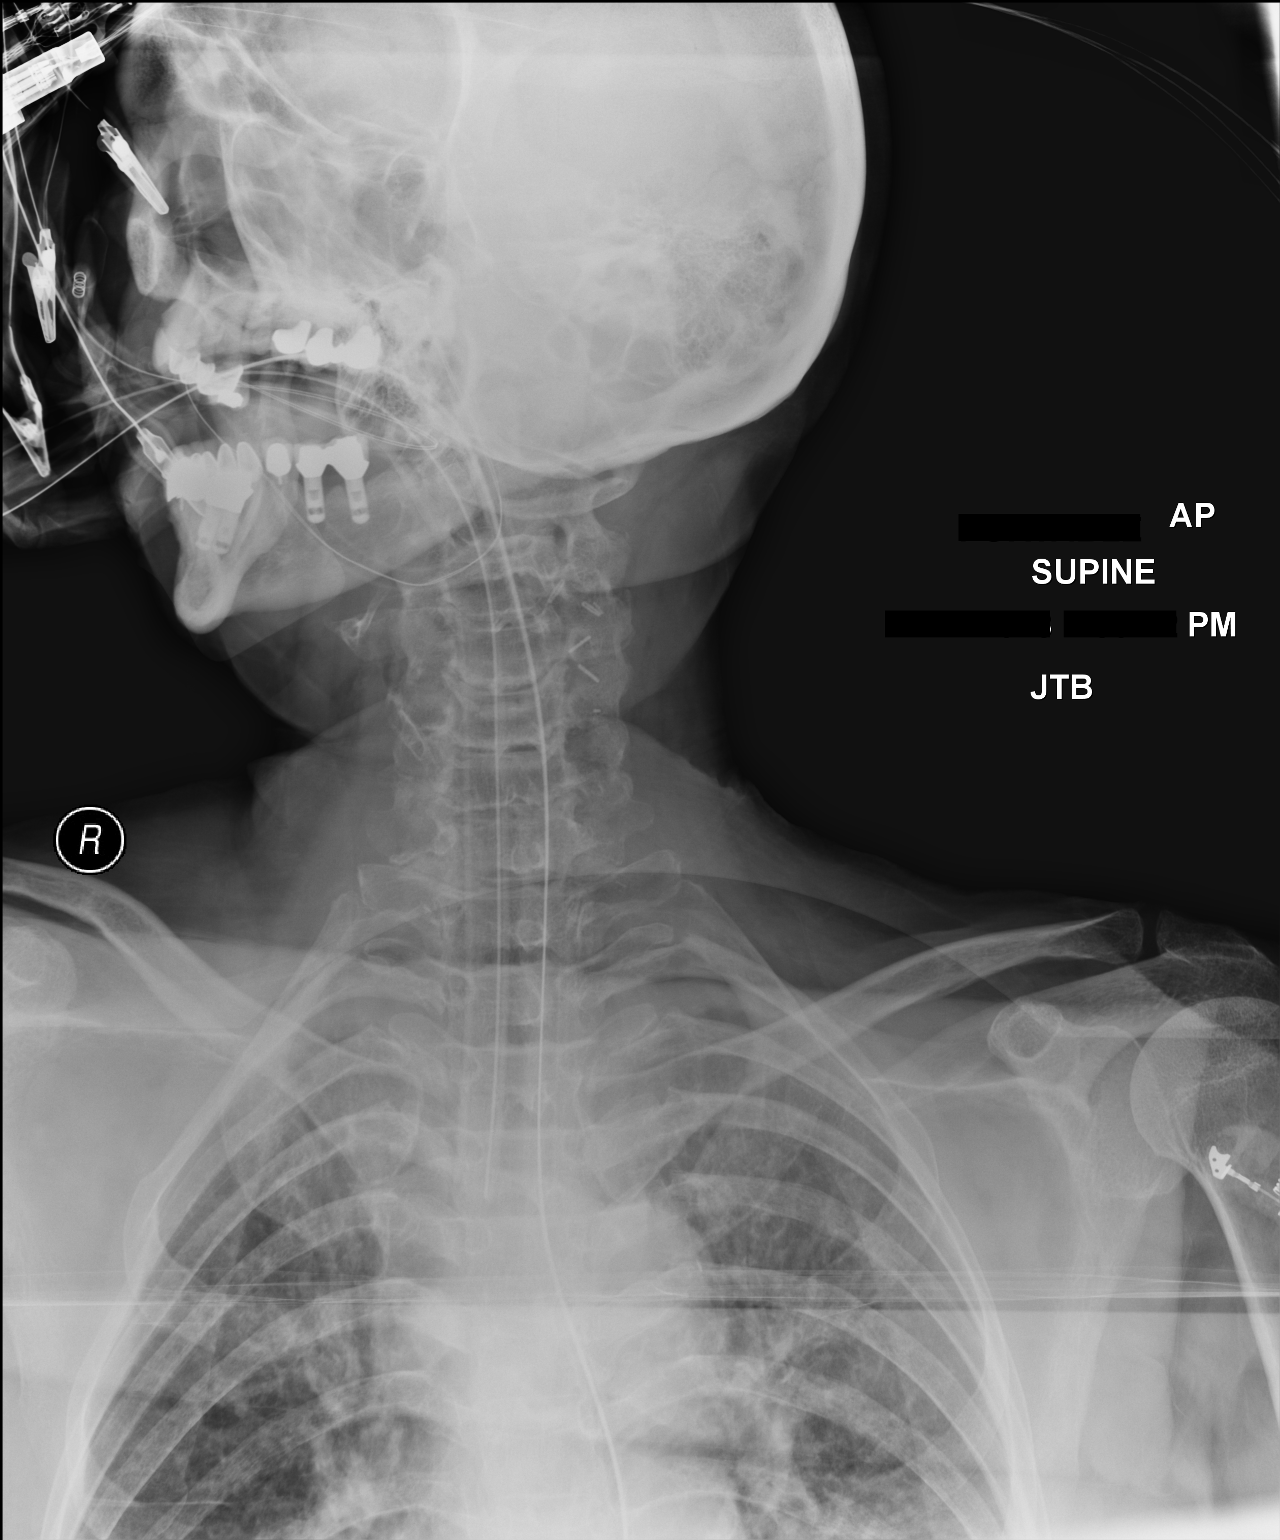

[1 of 1 positions shown; findings below may reference images not displayed]

FINDINGS: Several surgical clips overlie the operative bed of the mid aspect
of the left neck.

No definite radiopaque foreign body.

Endotracheal tube overlies the tracheal air column, with tip above
the carina.  An enteric tube terminates below the field of view.

Limited visualization of lung apices is negative for definite
pneumothorax.  Unchanged bones.
IMPRESSION: Surgical clips about the operative site without definite radiopaque
foreign body.

Above findings discussed with operating room circulating RN at

## 2014-01-05 ENCOUNTER — Other Ambulatory Visit: Payer: Self-pay | Admitting: Surgery

## 2014-01-05 DIAGNOSIS — Z48812 Encounter for surgical aftercare following surgery on the circulatory system: Secondary | ICD-10-CM

## 2014-01-05 DIAGNOSIS — I6529 Occlusion and stenosis of unspecified carotid artery: Secondary | ICD-10-CM

## 2014-01-17 HISTORY — PX: WH-MAMMOGRAPHY: HXRAD724

## 2014-06-21 ENCOUNTER — Ambulatory Visit: Payer: Medicare Other | Admitting: Family

## 2014-06-21 ENCOUNTER — Other Ambulatory Visit (HOSPITAL_COMMUNITY): Payer: Medicare Other

## 2014-06-22 ENCOUNTER — Ambulatory Visit: Payer: Medicare Other | Admitting: Family

## 2014-06-22 ENCOUNTER — Other Ambulatory Visit (HOSPITAL_COMMUNITY): Payer: Medicare Other

## 2014-06-24 ENCOUNTER — Encounter: Payer: Self-pay | Admitting: Family

## 2014-06-25 ENCOUNTER — Ambulatory Visit (INDEPENDENT_AMBULATORY_CARE_PROVIDER_SITE_OTHER): Payer: Medicare Other | Admitting: Family

## 2014-06-25 ENCOUNTER — Encounter: Payer: Self-pay | Admitting: Family

## 2014-06-25 ENCOUNTER — Ambulatory Visit (HOSPITAL_COMMUNITY)
Admission: RE | Admit: 2014-06-25 | Discharge: 2014-06-25 | Disposition: A | Discharge status: Home / Self Care | Payer: Medicare Other | Source: Ambulatory Visit | Attending: Family | Admitting: Family

## 2014-06-25 VITALS — BP 139/62 | HR 49 | Resp 14 | Ht 65.0 in | Wt 131.0 lb

## 2014-06-25 DIAGNOSIS — I6529 Occlusion and stenosis of unspecified carotid artery: Secondary | ICD-10-CM | POA: Insufficient documentation

## 2014-06-25 DIAGNOSIS — Z48812 Encounter for surgical aftercare following surgery on the circulatory system: Secondary | ICD-10-CM

## 2014-06-25 NOTE — Progress Notes (Signed)
Established Carotid Patient   History of Present Illness  Shirley Shea is a 78 y.o. female of Dr. Myra Gianotti.  The patient is back today for followup. She is status post left carotid endarterectomy in November 2013 and right carotid endarterectomy in January of 2014. Both were done for asymptomatic high-grade stenosis.   She was seen in between the 2 procedures by the nose and throat is that she had weak but functioning vocal cords.  Patient has Negative history of TIA or stroke symptom.  The patient denies amaurosis fugax or monocular blindness.  The patient  denies facial drooping.  Pt. denies hemiplegia.  The patient denies receptive or expressive aphasia.   Pt denies claudication symptoms with walking, denies non healing wounds.   Pt denies New Medical or Surgical History.  Pt Diabetic: Yes Pt smoker: non-smoker  Pt meds include: Statin : Yes ASA: Yes Other anticoagulants/antiplatelets: no   Past Medical History  Diagnosis Date  . Diverticulosis   . Hyperlipidemia   . IBS (irritable bowel syndrome)   . Allergy   . Carotid artery occlusion   . Hypertension   . Renal artery stenosis   . CAD (coronary artery disease)   . Anxiety   . Hiatal hernia   . Arthritis     hands  . GERD (gastroesophageal reflux disease)     Social History History  Substance Use Topics  . Smoking status: Never Smoker   . Smokeless tobacco: Never Used     Comment: couple oz wine daily  . Alcohol Use: 5.2 oz/week    7 Glasses of wine, 2 Drinks containing 0.5 oz of alcohol per week    Family History Family History  Problem Relation Age of Onset  . Hyperlipidemia Mother   . Hypertension Mother   . Diabetes Father   . COPD Father   . Cancer Sister     Surgical History Past Surgical History  Procedure Laterality Date  . Abdominal hysterectomy    . Rectal prolapse repair      [facility]. Dr. Mirian Mo  . Breast surgery      implants removed  . Eye surgery      tear duct  repair   bilateral  . Endarterectomy  10/09/2012    Procedure: ENDARTERECTOMY CAROTID;  Surgeon: Nada Libman, MD;  Location: [facility] OR;  Service: Vascular;  Laterality: Left;  . Patch angioplasty  10/09/2012    Procedure: PATCH ANGIOPLASTY;  Surgeon: Nada Libman, MD;  Location: [facility] OR;  Service: Vascular;  Laterality: Left;  . Endarterectomy  11/27/2012    Procedure: ENDARTERECTOMY CAROTID;  Surgeon: Nada Libman, MD;  Location: [facility] OR;  Service: Vascular;  Laterality: Right;  . Patch angioplasty  11/27/2012    Procedure: PATCH ANGIOPLASTY;  Surgeon: Nada Libman, MD;  Location: [facility] OR;  Service: Vascular;  Laterality: Right;  Using Vascu- Guard 1cm x 6cm patch.  Marland Kitchen Wh-mammography  March 2015    Allergies  Allergen Reactions  . Codeine Other (See Comments)    crazy  . Ace Inhibitors     cough    Current Outpatient Prescriptions  Medication Sig Dispense Refill  . acetaminophen (TYLENOL) 500 MG tablet Take 500 mg by mouth as needed.      . ALPRAZolam (XANAX) 0.5 MG tablet Take 0.5 mg by mouth daily.       Marland Kitchen amLODipine (NORVASC) 5 MG tablet Take 5 mg by mouth daily.      Marland Kitchen aspirin 81  MG tablet Take 81 mg by mouth daily.      . Calcium Carb-Cholecalciferol (CALCIUM + D3) 600-200 MG-UNIT TABS Take 1 tablet by mouth daily.       . cycloSPORINE (RESTASIS) 0.05 % ophthalmic emulsion Place 1 drop into both eyes at bedtime. Emulsion 1 into affected eye      . diphenhydrAMINE (BENADRYL) 25 mg capsule Take 25 mg by mouth every 6 (six) hours as needed. For allergies      . escitalopram (LEXAPRO) 10 MG tablet Take 10 mg by mouth at bedtime.       Marland Kitchen esomeprazole (NEXIUM) 40 MG capsule Take 40 mg by mouth daily.       . hydroxychloroquine (PLAQUENIL) 200 MG tablet Take 200 mg by mouth daily.      . metoprolol tartrate (LOPRESSOR) 25 MG tablet Take 25 mg by mouth 2 (two) times daily.      . Multiple Vitamins-Minerals (ICAPS PO) Take 1 tablet by mouth 2 (two) times daily.       . simvastatin  (ZOCOR) 20 MG tablet Take 20 mg by mouth every evening.      . zolpidem (AMBIEN) 10 MG tablet Take 5 mg by mouth at bedtime as needed. For sleep      . alendronate (FOSAMAX) 70 MG tablet Take 70 mg by mouth every 7 (seven) days. Take with a full glass of water on an empty stomach. Take on Tuesday      . niacin (NIASPAN) 500 MG CR tablet Take 500 mg by mouth at bedtime.        No current facility-administered medications for this visit.    Review of Systems : See HPI for pertinent positives and negatives.  Physical Examination  Filed Vitals:   06/25/14 1021 06/25/14 1024  BP: 138/67 139/62  Pulse: 49 49  Resp:  14  Height:  5\' 5"  (1.651 m)  Weight:  131 lb (59.421 kg)  SpO2:  99%   Body mass index is 21.8 kg/(m^2).  General: WDWN female in NAD GAIT: normal Eyes: PERRLA Pulmonary:  Non-labored, CTAB, Negative  Rales, Negative rhonchi, & Negative wheezing.  Cardiac: regular Rhythm ,  Negative detected murmur.  VASCULAR EXAM Carotid Bruits Right Left   Negative Negative    Aorta is not palpable. Radial pulses are 2+ palpable and equal.                                                                                                                       Gastrointestinal: soft, nontender, BS WNL, no r/g,  negative masses.  Musculoskeletal: Negative muscle atrophy/wasting. M/S 5/5 throughout, Extremities without ischemic changes.  Neurologic: A&O X 3; Appropriate Affect ; SENSATION ;normal;  Speech is normal CN 2-12 intact, Pain and light touch intact in extremities, Motor exam as listed above.   Non-Invasive Vascular Imaging CAROTID DUPLEX 06/25/2014   CEREBROVASCULAR DUPLEX EVALUATION    INDICATION: Carotid artery stenosis    PREVIOUS INTERVENTION(S): Right carotid endarterectomy 11/27/2012; Left  carotid endarterectomy 10/09/2012    DUPLEX EXAM:     RIGHT  LEFT  Peak Systolic Velocities (cm/s) End Diastolic Velocities (cm/s) Plaque LOCATION Peak Systolic Velocities  (cm/s) End Diastolic Velocities (cm/s) Plaque  63 12  CCA PROXIMAL 110 20 HT  72 17  CCA MID 101 26   59 13 HM CCA DISTAL 102 20 HT  509 76 HT ECA 134 12 HT  116 33 HM ICA PROXIMAL 116 35   124 28  ICA MID 135 36   134 35  ICA DISTAL 101 29     Carotid endarterectomy ICA / CCA Ratio (PSV) Carotid endarterectomy  Antegrade Vertebral Flow Antegrade  130 Brachial Systolic Pressure (mmHg) 138  Triphasic Brachial Artery Waveforms Triphasic    Plaque Morphology:  HM = Homogeneous, HT = Heterogeneous, CP = Calcific Plaque, SP = Smooth Plaque, IP = Irregular Plaque     ADDITIONAL FINDINGS:     IMPRESSION: Bilateral internal carotid arteries are patent with history of carotid endarterectomy, mild hyperplasia without hemodynamically significant stenosis present at the proximal to mid patches. Right external carotid artery stenosis present.    Compared to the previous exam:  Unchanged since previous study on 06/22/2013.      Assessment: Hulen LusterShelby J Shorkey is a 78 y.o. female who is status post left carotid endarterectomy in November 2013 and right carotid endarterectomy in January of 2014; both were done for asymptomatic high-grade stenosis.   She has no history of stroke or TIA. Carotid Duplex today demonstrates bilateral internal carotid arteries are patent with history of carotid endarterectomy, mild hyperplasia without hemodynamically significant stenosis present at the proximal to mid patches. Right external carotid artery stenosis present. Unchanged since previous study on 06/22/2013.  Plan: Follow-up in 1 year with Carotid Duplex scan.   I discussed in depth with the patient the nature of atherosclerosis, and emphasized the importance of maximal medical management including strict control of blood pressure, blood glucose, and lipid levels, obtaining regular exercise, and continued cessation of smoking.  The patient is aware that without maximal medical management the underlying  atherosclerotic disease process will progress, limiting the benefit of any interventions. The patient was given information about stroke prevention and what symptoms should prompt the patient to seek immediate medical care. Thank you for allowing us to participate in this patient's care.  Charisse MarchSuzanne Acacia Latorre, RN, MSN, FNP-C [facility] [place] Office: [phone]  Clinic Physician: Hart RochesterLawson on call  06/25/2014 10:35 AM

## 2014-06-25 NOTE — Patient Instructions (Signed)
Stroke Prevention Some medical conditions and behaviors are associated with an increased chance of having a stroke. You may prevent a stroke by making healthy choices and managing medical conditions. HOW CAN I REDUCE MY RISK OF HAVING A STROKE?   Stay physically active. Get at least 30 minutes of activity on most or all days.  Do not smoke. It may also be helpful to avoid exposure to secondhand smoke.  Limit alcohol use. Moderate alcohol use is considered to be:  No more than 2 drinks per day for men.  No more than 1 drink per day for nonpregnant women.  Eat healthy foods. This involves:  Eating 5 or more servings of fruits and vegetables a day.  Making dietary changes that address high blood pressure (hypertension), high cholesterol, diabetes, or obesity.  Manage your cholesterol levels.  Making food choices that are high in fiber and low in saturated fat, trans fat, and cholesterol may control cholesterol levels.  Take any prescribed medicines to control cholesterol as directed by your health care provider.  Manage your diabetes.  Controlling your carbohydrate and sugar intake is recommended to manage diabetes.  Take any prescribed medicines to control diabetes as directed by your health care provider.  Control your hypertension.  Making food choices that are low in salt (sodium), saturated fat, trans fat, and cholesterol is recommended to manage hypertension.  Take any prescribed medicines to control hypertension as directed by your health care provider.  Maintain a healthy weight.  Reducing calorie intake and making food choices that are low in sodium, saturated fat, trans fat, and cholesterol are recommended to manage weight.  Stop drug abuse.  Avoid taking birth control pills.  Talk to your health care provider about the risks of taking birth control pills if you are over 35 years old, smoke, get migraines, or have ever had a blood clot.  Get evaluated for sleep  disorders (sleep apnea).  Talk to your health care provider about getting a sleep evaluation if you snore a lot or have excessive sleepiness.  Take medicines only as directed by your health care provider.  For some people, aspirin or blood thinners (anticoagulants) are helpful in reducing the risk of forming abnormal blood clots that can lead to stroke. If you have the irregular heart rhythm of atrial fibrillation, you should be on a blood thinner unless there is a good reason you cannot take them.  Understand all your medicine instructions.  Make sure that other conditions (such as anemia or atherosclerosis) are addressed. SEEK IMMEDIATE MEDICAL CARE IF:   You have sudden weakness or numbness of the face, arm, or leg, especially on one side of the body.  Your face or eyelid droops to one side.  You have sudden confusion.  You have trouble speaking (aphasia) or understanding.  You have sudden trouble seeing in one or both eyes.  You have sudden trouble walking.  You have dizziness.  You have a loss of balance or coordination.  You have a sudden, severe headache with no known cause.  You have new chest pain or an irregular heartbeat. Any of these symptoms may represent a serious problem that is an emergency. Do not wait to see if the symptoms will go away. Get medical help at once. Call your local emergency services (911 in [place] Do not drive yourself to the hospital. Document Released: 12/13/2004 Document Revised: 03/22/2014 Document Reviewed: 05/08/2013 Exit[employer] Patient Information 2015 [employer]. This information is not intended to replace advice given   to you by your health care provider. Make sure you discuss any questions you have with your health care provider.  

## 2014-09-15 ENCOUNTER — Encounter: Payer: Self-pay | Admitting: Interventional Cardiology

## 2014-09-15 ENCOUNTER — Ambulatory Visit (INDEPENDENT_AMBULATORY_CARE_PROVIDER_SITE_OTHER): Payer: Medicare Other | Admitting: Interventional Cardiology

## 2014-09-15 VITALS — BP 132/70 | HR 52 | Ht 64.0 in | Wt 130.1 lb

## 2014-09-15 DIAGNOSIS — I491 Atrial premature depolarization: Secondary | ICD-10-CM

## 2014-09-15 DIAGNOSIS — I701 Atherosclerosis of renal artery: Secondary | ICD-10-CM

## 2014-09-15 DIAGNOSIS — R0789 Other chest pain: Secondary | ICD-10-CM

## 2014-09-15 DIAGNOSIS — I10 Essential (primary) hypertension: Secondary | ICD-10-CM

## 2014-09-15 NOTE — Patient Instructions (Signed)
Your physician has requested that you have an exercise tolerance test. For further information please visit [email or web address] Please also follow instruction sheet, as given.  Your physician wants you to follow-up in: 1 year with Dr. Eldridge DaceVaranasi. You will receive a reminder letter in the mail two months in advance. If you don't receive a letter, please call our office to schedule the follow-up appointment.  Your physician recommends that you continue on your current medications as directed. Please refer to the Current Medication list given to you today.

## 2014-09-15 NOTE — Progress Notes (Signed)
Patient ID: Shirley LusterShelby J Lehenbauer, female   DOB: Dec 19, 1935, 78 y.o.   MRN: [id number] Patient ID: Shirley LusterShelby J Shea, female   DOB: Dec 19, 1935, 78 y.o.   MRN: [id number]    [address] [place], [place]  [address] Phone: [phone] Fax:  [phone]  Date:  09/15/2014   ID:  Shirley Shea, DOB Dec 19, 1935, MRN [id number]  PCP:  Ralene OkMOREIRA,ROY, MD      History of Present Illness: Shirley Shea is a 78 y.o. female who has had HTN and renal artery stenosis. BPs at home range from 100-140 systolic. SHe walks some at home. She goes up and down stairs without problems. No CP or SHOB. She has some lightheadedness with systolics at the extreme ends of the range. No extreme blood pressures of late. No recent syncope.  In 2013, she had bilateral carotid stenosis diagnosed and had bilateral CEA.  She did very well. BP has been controlled since starting Norvasc. No CP, SHOB.  She walks a little for exercise.  She feels her stamina is a little decreased.     Dull atypical chest pain described below.       Wt Readings from Last 3 Encounters:  09/15/14 130 lb 1.9 oz (59.022 kg)  06/25/14 131 lb (59.421 kg)  09/08/13 132 lb 6.4 oz (60.056 kg)     Past Medical History  Diagnosis Date  . Diverticulosis   . Hyperlipidemia   . IBS (irritable bowel syndrome)   . Allergy   . Carotid artery occlusion   . Hypertension   . Renal artery stenosis   . CAD (coronary artery disease)   . Anxiety   . Hiatal hernia   . Arthritis     hands  . GERD (gastroesophageal reflux disease)     Current Outpatient Prescriptions  Medication Sig Dispense Refill  . acetaminophen (TYLENOL) 500 MG tablet Take 500 mg by mouth as needed.      . ALPRAZolam (XANAX) 0.5 MG tablet Take 0.5 mg by mouth daily.       Marland Kitchen. amLODipine (NORVASC) 5 MG tablet Take 5 mg by mouth daily.      Marland Kitchen. aspirin 81 MG tablet Take 81 mg by mouth daily.      . Calcium Carb-Cholecalciferol (CALCIUM + D3) 600-200 MG-UNIT TABS Take 1 tablet  by mouth daily.       . cycloSPORINE (RESTASIS) 0.05 % ophthalmic emulsion Place 1 drop into both eyes at bedtime. Emulsion 1 into affected eye      . diphenhydrAMINE (BENADRYL) 25 mg capsule Take 25 mg by mouth every 6 (six) hours as needed. For allergies      . escitalopram (LEXAPRO) 10 MG tablet Take 10 mg by mouth at bedtime.       Marland Kitchen. esomeprazole (NEXIUM) 40 MG capsule Take 40 mg by mouth daily.       . hydroxychloroquine (PLAQUENIL) 200 MG tablet Take 200 mg by mouth daily.      . metoprolol tartrate (LOPRESSOR) 25 MG tablet Take 25 mg by mouth 2 (two) times daily.      . Multiple Vitamins-Minerals (ICAPS PO) Take 1 tablet by mouth 2 (two) times daily.       . simvastatin (ZOCOR) 20 MG tablet Take 20 mg by mouth every evening.      . zolpidem (AMBIEN) 10 MG tablet Take 5 mg by mouth at bedtime as needed. For sleep       No current facility-administered medications for  this visit.    Allergies:    Allergies  Allergen Reactions  . Codeine Other (See Comments)    crazy  . Ace Inhibitors     cough    Social History:  The patient  reports that she has never smoked. She has never used smokeless tobacco. She reports that she drinks about 5.2 ounces of alcohol per week. She reports that she does not use illicit drugs.   Family History:  The patient's family history includes COPD in her father; Cancer in her sister; Diabetes in her father; Hyperlipidemia in her mother; Hypertension in her mother.   ROS:  Please see the history of present illness.  No nausea, vomiting.  No fevers, chills.  No focal weakness.  No dysuria. Fatigue.     All other systems reviewed and negative.   PHYSICAL EXAM: VS:  BP 132/70  Pulse 52  Ht 5\' 4"  (1.626 m)  Wt 130 lb 1.9 oz (59.022 kg)  BMI 22.32 kg/m2 Well nourished, well developed, in no acute distress HEENT: normal Neck: no JVD, no carotid bruits Cardiac:  normal S1, S2; RRR;  Lungs:  clear to auscultation bilaterally, no wheezing, rhonchi or  rales Abd: soft, nontender, no hepatomegaly Ext: no edema Skin: warm and dry Neuro:   no focal abnormalities noted  EKG:  Normal sinus rhythm, PACs, no ST segment changes     ASSESSMENT AND PLAN:  Renal artery stenosis   Renal function and blood pressure have been stable per her report. Would not plan for angiogram at this time. Creatinine followed by PMD. 10/14 LDL 86, TG 66, HDL 73.  Cr 1.18.  10/15: LDL 84, HDL 66, TG 62 2. Essential hypertension, benign  Continue Metoprolol Tartrate Tablet, 25 MG, 1 tablet, Orally, Twice a day BP improved since starting Norvasc.   Arrhythmia:  PACs. No further palpitations.   No documented atrial fibrillation.  If she develops any symptoms, could consider event monitor.  Chest pain: dull pain coming on less than once a month.  It lasts a few minutes.  Not related to exertion.  Given fatigue, will plan for ETT.     Signed, Fredric MareJay S. Commodore Bellew, MD, [facility] 09/15/2014 11:14 AM

## 2014-10-12 ENCOUNTER — Encounter: Payer: Self-pay | Admitting: Nurse Practitioner

## 2014-10-12 ENCOUNTER — Ambulatory Visit (INDEPENDENT_AMBULATORY_CARE_PROVIDER_SITE_OTHER): Payer: Medicare Other | Admitting: Nurse Practitioner

## 2014-10-12 VITALS — BP 145/89 | HR 78

## 2014-10-12 DIAGNOSIS — R0789 Other chest pain: Secondary | ICD-10-CM

## 2014-10-12 DIAGNOSIS — I10 Essential (primary) hypertension: Secondary | ICD-10-CM

## 2014-10-12 NOTE — Progress Notes (Signed)
Exercise Treadmill Test  Pre-Exercise Testing Evaluation Rhythm: normal sinus  Rate: 78 bpm     Test  Exercise Tolerance Test Ordering MD: Everette RankJay Varanasi, MD  Interpreting MD: Norma FredricksonLori Gerhardt, NP  Unique Test No: 1  Treadmill:  1  Indication for ETT: chest pain - rule out ischemia  Contraindication to ETT: No   Stress Modality: exercise - treadmill  Cardiac Imaging Performed: non   Protocol: standard Bruce - maximal  Max BP:  216/103  Max MPHR (bpm):  144 85% MPR (bpm):  122  MPHR obtained (bpm):  131 % MPHR obtained:  90%  Reached 85% MPHR (min:sec):  2:25 Total Exercise Time (min-sec):  6 minutes  Workload in METS:  7.0 Borg Scale: 13  Reason ETT Terminated:  desired heart rate attained    ST Segment Analysis At Rest: normal ST segments - no evidence of significant ST depression With Exercise: no evidence of significant ST depression; some non-specific St/T wave changes in recovery.  Other Information Arrhythmia:  No Angina during ETT:  absent (0) Quality of ETT:  diagnostic  ETT Interpretation:  normal - no evidence of ischemia by ST analysis  Comments: Patient presents today for routine GXT. Has had atypical chest pain. No exertional symptoms noted. Last dose of metoprolol was on Sunday.  No medicines taken today. Does not really exercise regularly.   Today the patient exercised on the standard Bruce protocol for a total of 6 minutes. Fair exercise tolerance.  Mildly hypertensive blood pressure response. BP to 216/103 within first stage of Bruce protocol. Clinically negative for chest pain. Test was stopped due to achievement of target HR.  EKG negative for significant ischemia -  Does have ST/T wave changes in recovery - no symptoms reported. No significant arrhythmia noted.   Recommendations: Continue with current medicines  Reviewed with Dr. Eldridge DaceVaranasi - recommended trying to increase her activity level  If symptoms persist - then will consider Myoview.   Take medicines  when gets home.   See back as planned  Call the [facility] office at [phone] if you have any questions, problems or concerns.   Rosalio MacadamiaLori C. Gerhardt, RN, ANP-C [facility] [address] [place], [place]  [address] [phone]

## 2015-07-04 ENCOUNTER — Encounter (HOSPITAL_COMMUNITY): Payer: Self-pay

## 2015-07-04 ENCOUNTER — Ambulatory Visit: Payer: Medicare Other | Admitting: Family

## 2015-07-13 ENCOUNTER — Encounter: Payer: Self-pay | Admitting: Interventional Cardiology

## 2015-07-28 ENCOUNTER — Encounter: Payer: Self-pay | Admitting: Family

## 2015-07-29 ENCOUNTER — Other Ambulatory Visit: Payer: Self-pay | Admitting: Vascular Surgery

## 2015-07-29 ENCOUNTER — Encounter: Payer: Self-pay | Admitting: Family

## 2015-07-29 ENCOUNTER — Ambulatory Visit (INDEPENDENT_AMBULATORY_CARE_PROVIDER_SITE_OTHER): Payer: Medicare Other | Admitting: Family

## 2015-07-29 ENCOUNTER — Ambulatory Visit (HOSPITAL_COMMUNITY)
Admission: RE | Admit: 2015-07-29 | Discharge: 2015-07-29 | Disposition: A | Discharge status: Home / Self Care | Payer: Medicare Other | Source: Ambulatory Visit | Attending: Family | Admitting: Family

## 2015-07-29 VITALS — BP 143/71 | HR 52 | Temp 98.2°F | Ht 64.0 in | Wt 129.0 lb

## 2015-07-29 DIAGNOSIS — Z48812 Encounter for surgical aftercare following surgery on the circulatory system: Secondary | ICD-10-CM

## 2015-07-29 DIAGNOSIS — Z9889 Other specified postprocedural states: Secondary | ICD-10-CM | POA: Diagnosis not present

## 2015-07-29 DIAGNOSIS — I6521 Occlusion and stenosis of right carotid artery: Secondary | ICD-10-CM | POA: Insufficient documentation

## 2015-07-29 DIAGNOSIS — I6529 Occlusion and stenosis of unspecified carotid artery: Secondary | ICD-10-CM

## 2015-07-29 DIAGNOSIS — I6523 Occlusion and stenosis of bilateral carotid arteries: Secondary | ICD-10-CM

## 2015-07-29 NOTE — Progress Notes (Signed)
Established Carotid Patient   History of Present Illness  Shirley Shea is a 79 y.o. female patient of Dr. Myra Gianotti who returns today for followup.  She is status post left carotid endarterectomy in November 2013 and right carotid endarterectomy in January of 2014. Both were done for asymptomatic high-grade stenosis.  She was seen in between the 2 procedures by ENT; she had weak but functioning vocal cords.  Patient has no history of TIA or stroke symptoms. The patient denies amaurosis fugax or monocular blindness. The patient denies facial drooping. Pt. denies hemiplegia. The patient denies receptive or expressive aphasia.  Pt denies claudication symptoms with walking, denies non healing wounds.  She has SLE (affects her skin) and arthritis, neither of which are disabling for her.  Pt denies New Medical or Surgical History.  Pt Diabetic: no Pt smoker: non-smoker  Pt meds include: Statin : Yes ASA: Yes Other anticoagulants/antiplatelets: no   Past Medical History  Diagnosis Date  . Diverticulosis   . Hyperlipidemia   . IBS (irritable bowel syndrome)   . Allergy   . Carotid artery occlusion   . Hypertension   . Renal artery stenosis   . CAD (coronary artery disease)   . Anxiety   . Hiatal hernia   . Arthritis     hands  . GERD (gastroesophageal reflux disease)     Social History Social History  Substance Use Topics  . Smoking status: Never Smoker   . Smokeless tobacco: Never Used     Comment: couple oz wine daily  . Alcohol Use: No    Family History Family History  Problem Relation Age of Onset  . Hyperlipidemia Mother   . Hypertension Mother   . Diabetes Father   . COPD Father   . Cancer Sister     Surgical History Past Surgical History  Procedure Laterality Date  . Abdominal hysterectomy    . Rectal prolapse repair      [facility]. Dr. Mirian Mo  . Breast surgery      implants removed  . Eye surgery      tear duct repair    bilateral  . Endarterectomy  10/09/2012    Procedure: ENDARTERECTOMY CAROTID;  Surgeon: Nada Libman, MD;  Location: [facility];  Service: Vascular;  Laterality: Left;  . Patch angioplasty  10/09/2012    Procedure: PATCH ANGIOPLASTY;  Surgeon: Nada Libman, MD;  Location: [facility];  Service: Vascular;  Laterality: Left;  . Endarterectomy  11/27/2012    Procedure: ENDARTERECTOMY CAROTID;  Surgeon: Nada Libman, MD;  Location: [facility] OR;  Service: Vascular;  Laterality: Right;  . Patch angioplasty  11/27/2012    Procedure: PATCH ANGIOPLASTY;  Surgeon: Nada Libman, MD;  Location: [facility] OR;  Service: Vascular;  Laterality: Right;  Using Vascu- Guard 1cm x 6cm patch.  Marland Kitchen Wh-mammography  March 2015    Allergies  Allergen Reactions  . Codeine Other (See Comments)    crazy  . Ace Inhibitors     cough    Current Outpatient Prescriptions  Medication Sig Dispense Refill  . acetaminophen (TYLENOL) 500 MG tablet Take 500 mg by mouth as needed.    . ALPRAZolam (XANAX) 0.5 MG tablet Take 0.5 mg by mouth daily.     Marland Kitchen amLODipine (NORVASC) 5 MG tablet Take 5 mg by mouth daily.    Marland Kitchen aspirin 81 MG tablet Take 81 mg by mouth daily.    . Calcium Carb-Cholecalciferol (CALCIUM + D3) 600-200 MG-UNIT TABS  Take 1 tablet by mouth daily.     . cycloSPORINE (RESTASIS) 0.05 % ophthalmic emulsion Place 1 drop into both eyes at bedtime. Emulsion 1 into affected eye    . diphenhydrAMINE (BENADRYL) 25 mg capsule Take 25 mg by mouth every 6 (six) hours as needed. For allergies    . escitalopram (LEXAPRO) 10 MG tablet Take 10 mg by mouth at bedtime.     Marland Kitchen esomeprazole (NEXIUM) 40 MG capsule Take 40 mg by mouth daily.     . hydroxychloroquine (PLAQUENIL) 200 MG tablet Take 200 mg by mouth daily.    . metoprolol tartrate (LOPRESSOR) 25 MG tablet Take 25 mg by mouth 2 (two) times daily.    . Multiple Vitamins-Minerals (ICAPS PO) Take 1 tablet by mouth 2 (two) times daily.     . simvastatin (ZOCOR) 20 MG tablet Take 20 mg by  mouth every evening.    . zolpidem (AMBIEN) 10 MG tablet Take 5 mg by mouth at bedtime as needed. For sleep     No current facility-administered medications for this visit.    Review of Systems : See HPI for pertinent positives and negatives.  Physical Examination  Filed Vitals:   07/29/15 1058 07/29/15 1101  BP: 134/65 143/71  Pulse: 52   Temp: 98.2 F (36.8 C)   TempSrc: Oral   Height: 5\' 4"  (1.626 m)   Weight: 129 lb (58.514 kg)   SpO2: 99%    Body mass index is 22.13 kg/(m^2).  General: WDWN female in NAD GAIT: normal Eyes: PERRLA Pulmonary: Non-labored, CTAB, no rales,norhonchi, & no wheezing.  Cardiac: regular rhythm. no detected murmur.  VASCULAR EXAM Carotid Bruits Right Left   Negative Negative   Aorta is not palpable. Radial pulses are 2+ palpable and equal.     Gastrointestinal: soft, nontender, BS WNL, no r/g, no palpable masses.  Musculoskeletal: Negative muscle atrophy/wasting. M/S 5/5 throughout, Extremities without ischemic changes.  Neurologic: A&O X 3; Appropriate Affect, Speech is normal CN 2-12 intact, Pain and light touch intact in extremities, Motor exam as listed above.           Non-Invasive Vascular Imaging CAROTID DUPLEX 07/29/2015   CEREBROVASCULAR DUPLEX EVALUATION    INDICATION: Carotid artery disease     PREVIOUS INTERVENTION(S): Right carotid endarterectomy 11/27/2012 Left carotid endarterectomy 10/09/2012    DUPLEX EXAM:     RIGHT  LEFT  Peak Systolic Velocities (cm/s) End Diastolic Velocities (cm/s) Plaque LOCATION Peak Systolic Velocities (cm/s) End Diastolic Velocities (cm/s) Plaque  55 14  CCA PROXIMAL 82 21 HT  64 15  CCA MID 74 19   56 10 HM CCA DISTAL 78 22 HT  524 50 HT ECA 97 13 HT  83 24 HM ICA PROXIMAL 70 20   101 25  ICA MID 92 26   118 31  ICA DISTAL 113 31     NA ICA  / CCA Ratio (PSV) NA  Antegrade  Vertebral Flow Antegrade   135 Brachial Systolic Pressure (mmHg) 135  Multiphasic (Subclavian artery) Brachial Artery Waveforms Multiphasic (Subclavian artery)    Plaque Morphology:  HM = Homogeneous, HT = Heterogeneous, CP = Calcific Plaque, SP = Smooth Plaque, IP = Irregular Plaque  ADDITIONAL FINDINGS:     IMPRESSION: Patent bilateral carotid endarterectomy sites with evidence of mild hyperplasia at the proximal patch sites. Right external carotid artery stenosis.     Compared to the previous exam:  No significant change in comparison to the last exam on 06/25/2013.  Assessment: Shirley Shea is a 79 y.o. female who is status post left carotid endarterectomy in November 2013 and right carotid endarterectomy in January of 2014. Both were done for asymptomatic high-grade stenosis.  She has no history of stroke or TIA.   Today's carotid Duplex suggests patent bilateral carotid endarterectomy sites with evidence of mild hyperplasia at the proximal patch sites. No significant change in comparison to the last exam on 06/25/2013.     Plan: Follow-up in 1 year with Carotid Duplex.   I discussed in depth with the patient the nature of atherosclerosis, and emphasized the importance of maximal medical management including strict control of blood pressure, blood glucose, and lipid levels, obtaining regular exercise, and continued cessation of smoking.  The patient is aware that without maximal medical management the underlying atherosclerotic disease process will progress, limiting the benefit of any interventions. The patient was given information about stroke prevention and what symptoms should prompt the patient to seek immediate medical care. Thank you for allowing [place] to participate in this patient's care.  Charisse March, RN, MSN, FNP-C [facility] Office: [phone]  Clinic Physician: Imogene Burn  07/29/2015 11:09  AM

## 2015-07-29 NOTE — Patient Instructions (Signed)
Stroke Prevention Some medical conditions and behaviors are associated with an increased chance of having a stroke. You may prevent a stroke by making healthy choices and managing medical conditions. HOW CAN I REDUCE MY RISK OF HAVING A STROKE?   Stay physically active. Get at least 30 minutes of activity on most or all days.  Do not smoke. It may also be helpful to avoid exposure to secondhand smoke.  Limit alcohol use. Moderate alcohol use is considered to be:  No more than 2 drinks per day for men.  No more than 1 drink per day for nonpregnant women.  Eat healthy foods. This involves:  Eating 5 or more servings of fruits and vegetables a day.  Making dietary changes that address high blood pressure (hypertension), high cholesterol, diabetes, or obesity.  Manage your cholesterol levels.  Making food choices that are high in fiber and low in saturated fat, trans fat, and cholesterol may control cholesterol levels.  Take any prescribed medicines to control cholesterol as directed by your health care provider.  Manage your diabetes.  Controlling your carbohydrate and sugar intake is recommended to manage diabetes.  Take any prescribed medicines to control diabetes as directed by your health care provider.  Control your hypertension.  Making food choices that are low in salt (sodium), saturated fat, trans fat, and cholesterol is recommended to manage hypertension.  Take any prescribed medicines to control hypertension as directed by your health care provider.  Maintain a healthy weight.  Reducing calorie intake and making food choices that are low in sodium, saturated fat, trans fat, and cholesterol are recommended to manage weight.  Stop drug abuse.  Avoid taking birth control pills.  Talk to your health care provider about the risks of taking birth control pills if you are over 35 years old, smoke, get migraines, or have ever had a blood clot.  Get evaluated for sleep  disorders (sleep apnea).  Talk to your health care provider about getting a sleep evaluation if you snore a lot or have excessive sleepiness.  Take medicines only as directed by your health care provider.  For some people, aspirin or blood thinners (anticoagulants) are helpful in reducing the risk of forming abnormal blood clots that can lead to stroke. If you have the irregular heart rhythm of atrial fibrillation, you should be on a blood thinner unless there is a good reason you cannot take them.  Understand all your medicine instructions.  Make sure that other conditions (such as anemia or atherosclerosis) are addressed. SEEK IMMEDIATE MEDICAL CARE IF:   You have sudden weakness or numbness of the face, arm, or leg, especially on one side of the body.  Your face or eyelid droops to one side.  You have sudden confusion.  You have trouble speaking (aphasia) or understanding.  You have sudden trouble seeing in one or both eyes.  You have sudden trouble walking.  You have dizziness.  You have a loss of balance or coordination.  You have a sudden, severe headache with no known cause.  You have new chest pain or an irregular heartbeat. Any of these symptoms may represent a serious problem that is an emergency. Do not wait to see if the symptoms will go away. Get medical help at once. Call your local emergency services (911 in [place] Do not drive yourself to the hospital. Document Released: 12/13/2004 Document Revised: 03/22/2014 Document Reviewed: 05/08/2013 Exit[employer] Patient Information 2015 [employer]. This information is not intended to replace advice given   to you by your health care provider. Make sure you discuss any questions you have with your health care provider.  

## 2015-08-01 ENCOUNTER — Encounter: Payer: Self-pay | Admitting: Internal Medicine

## 2015-10-28 ENCOUNTER — Ambulatory Visit (INDEPENDENT_AMBULATORY_CARE_PROVIDER_SITE_OTHER): Payer: Medicare Other | Admitting: Interventional Cardiology

## 2015-10-28 ENCOUNTER — Encounter: Payer: Self-pay | Admitting: Interventional Cardiology

## 2015-10-28 VITALS — BP 142/68 | HR 62 | Ht 64.0 in | Wt 130.0 lb

## 2015-10-28 DIAGNOSIS — I491 Atrial premature depolarization: Secondary | ICD-10-CM | POA: Diagnosis not present

## 2015-10-28 DIAGNOSIS — I701 Atherosclerosis of renal artery: Secondary | ICD-10-CM | POA: Diagnosis not present

## 2015-10-28 DIAGNOSIS — I10 Essential (primary) hypertension: Secondary | ICD-10-CM

## 2015-10-28 DIAGNOSIS — I779 Disorder of arteries and arterioles, unspecified: Secondary | ICD-10-CM | POA: Diagnosis not present

## 2015-10-28 DIAGNOSIS — I739 Peripheral vascular disease, unspecified: Secondary | ICD-10-CM

## 2015-10-28 NOTE — Patient Instructions (Signed)
Dr Varanasi has made no changes today in your current medications or treatment plan.  Your physician recommends that you schedule a follow-up appointment in 1 year. You will receive a reminder letter in the mail two months in advance. If you don't receive a letter, please call our office to schedule the follow-up appointment.  If you need a refill on your cardiac medications before your next appointment, please call your pharmacy. 

## 2015-10-28 NOTE — Progress Notes (Signed)
Patient ID: Shirley Shea, female   DOB: 01-16-1936, 79 y.o.   MRN: [id number]     Cardiology Office Note   Date:  10/28/2015   ID:  Shirley Shea 1936-07-18, MRN [id number]  PCP:  Shirley Ok, MD    No chief complaint on file. f/u PAD, HTN   Wt Readings from Last 3 Encounters:  10/28/15 130 lb (58.968 kg)  07/29/15 129 lb (58.514 kg)  09/15/14 130 lb 1.9 oz (59.022 kg)       History of Present Illness: Shirley Shea is a 79 y.o. female  who has had HTN and renal artery stenosis. BPs at home range from 100-140 systolic. SHe walks some at home. She goes up and down stairs without problems. No CP or SHOB. She has some lightheadedness with systolics at the extreme ends of the range. No extreme blood pressures of late. No recent syncope.  Exercise level and duration has decreased.  In 2013, she had bilateral carotid stenosis diagnosed and had bilateral CEA. She did very well. BP has been controlled since starting Norvasc. No CP, SHOB. She walks a little for exercise. She is not getting 150 minutes/week at this time.    She is getting her routine carotid ultrasounds as well.    Past Medical History  Diagnosis Date  . Diverticulosis   . Hyperlipidemia   . IBS (irritable bowel syndrome)   . Allergy   . Carotid artery occlusion   . Hypertension   . Renal artery stenosis (HCC)   . CAD (coronary artery disease)   . Anxiety   . Hiatal hernia   . Arthritis     hands  . GERD (gastroesophageal reflux disease)     Past Surgical History  Procedure Laterality Date  . Abdominal hysterectomy    . Rectal prolapse repair      [facility]. Dr. Mirian Mo  . Breast surgery      implants removed  . Eye surgery      tear duct repair   bilateral  . Endarterectomy  10/09/2012    Procedure: ENDARTERECTOMY CAROTID;  Surgeon: Nada Libman, MD;  Location: [facility] OR;  Service: Vascular;  Laterality: Left;  . Patch angioplasty  10/09/2012    Procedure: PATCH ANGIOPLASTY;   Surgeon: Nada Libman, MD;  Location: [facility] OR;  Service: Vascular;  Laterality: Left;  . Endarterectomy  11/27/2012    Procedure: ENDARTERECTOMY CAROTID;  Surgeon: Nada Libman, MD;  Location: [facility] OR;  Service: Vascular;  Laterality: Right;  . Patch angioplasty  11/27/2012    Procedure: PATCH ANGIOPLASTY;  Surgeon: Nada Libman, MD;  Location: [facility] OR;  Service: Vascular;  Laterality: Right;  Using Vascu- Guard 1cm x 6cm patch.  Marland Kitchen Wh-mammography  March 2015     Current Outpatient Prescriptions  Medication Sig Dispense Refill  . acetaminophen (TYLENOL) 500 MG tablet Take 500 mg by mouth as needed.    . ALPRAZolam (XANAX) 0.5 MG tablet Take 0.5 mg by mouth daily.     Marland Kitchen amLODipine (NORVASC) 5 MG tablet Take 5 mg by mouth daily.    Marland Kitchen aspirin 81 MG tablet Take 81 mg by mouth daily.    . Calcium Carb-Cholecalciferol (CALCIUM + D3) 600-200 MG-UNIT TABS Take 1 tablet by mouth daily.     . cycloSPORINE (RESTASIS) 0.05 % ophthalmic emulsion Place 1 drop into both eyes at bedtime. Emulsion 1 into affected eye    . diphenhydrAMINE (BENADRYL) 25 mg capsule Take 25 mg  by mouth every 6 (six) hours as needed. For allergies    . escitalopram (LEXAPRO) 10 MG tablet Take 10 mg by mouth at bedtime.     Marland Kitchen. esomeprazole (NEXIUM) 40 MG capsule Take 40 mg by mouth daily.     . hydroxychloroquine (PLAQUENIL) 200 MG tablet Take 200 mg by mouth daily.    . metoprolol tartrate (LOPRESSOR) 25 MG tablet Take 25 mg by mouth 2 (two) times daily.    . Multiple Vitamins-Minerals (ICAPS PO) Take 1 tablet by mouth 2 (two) times daily.     . simvastatin (ZOCOR) 20 MG tablet Take 20 mg by mouth every evening.    . zolpidem (AMBIEN) 10 MG tablet Take 5 mg by mouth at bedtime as needed. For sleep     No current facility-administered medications for this visit.    Allergies:   Codeine and Ace inhibitors    Social History:  The patient  reports that she has never smoked. She has never used smokeless tobacco. She reports that  she does not drink alcohol or use illicit drugs.   Family History:  The patient's family history includes COPD in her father; Cancer in her sister; Diabetes in her father; Heart attack in her sister; Hyperlipidemia in her mother; Hypertension in her mother; Stroke in her son.    ROS:  Please see the history of present illness.   Otherwise, review of systems are positive for rare joint pains.   All other systems are reviewed and negative.    PHYSICAL EXAM: VS:  BP 142/68 mmHg  Pulse 62  Ht 5\' 4"  (1.626 m)  Wt 130 lb (58.968 kg)  BMI 22.30 kg/m2 , BMI Body mass index is 22.3 kg/(m^2). GEN: Well nourished, well developed, in no acute distress HEENT: normal Neck: no JVD, carotid bruits, or masses Cardiac: RRR; no murmurs, rubs, or gallops,no edema  Respiratory:  clear to auscultation bilaterally, normal work of breathing GI: soft, nontender, nondistended, + BS MS: no deformity or atrophy Skin: warm and dry, no rash Neuro:  Strength and sensation are intact Psych: euthymic mood, full affect   EKG:   The ekg ordered today demonstrates normal ECG   Recent Labs: No results found for requested labs within last 365 days.   Lipid Panel    Component Value Date/Time   CHOL  07/26/2008 0630    175        ATP III CLASSIFICATION:  <200     mg/dL   Desirable  [id number]  mg/dL   Borderline High  >=696>=240    mg/dL   High   TRIG 50 29/52/841309/05/2008 0630   HDL 67 07/26/2008 0630   CHOLHDL 2.6 07/26/2008 0630   VLDL 10 07/26/2008 0630   LDLCALC  07/26/2008 0630    98        Total Cholesterol/HDL:CHD Risk Coronary Heart Disease Risk Table                     Men   Women  1/2 Average Risk   3.4   3.3     Other studies Reviewed: Additional studies/ records that were reviewed today with results demonstrating: .   ASSESSMENT AND PLAN:  1. Renal artery stenosis: Cr. Stable.  Most recent value 1.19.  BP stable at home. Continue current medicines.  Her labs are followed by her primary care doctor  and will be repeated in January.  If repeat creatinine is higher, would have to consider reimaging of her  renal arteries. I encouraged her to stay well hydrated. 2. Hypertension: Continue amlodipine and metoprolol. Readings are well controlled at home. 3. Chest pain: Resolved. Negative stress test in 2015. 4. Hyperlipidemia: Lipids from August 2016 were reviewed. Well controlled. Continue simvastatin. 5. PACs: No symptoms at this time. 6. Carotid artery disease: Following up with vascular surgeons.   Current medicines are reviewed at length with the patient today.  The patient concerns regarding her medicines were addressed.  The following changes have been made:  No change  Labs/ tests ordered today include:  No orders of the defined types were placed in this encounter.    Recommend 150 minutes/week of aerobic exercise Low fat, low carb, high fiber diet recommended  Disposition:   FU in 1 year   Delorise Jackson., MD  10/28/2015 9:41 AM    [facility] [address] [place], [place], [place]  [address] Phone: [phone]; Fax: [phone]

## 2015-12-05 ENCOUNTER — Encounter: Payer: Self-pay | Admitting: Interventional Cardiology

## 2016-01-19 ENCOUNTER — Encounter: Payer: Self-pay | Admitting: Internal Medicine

## 2016-07-26 ENCOUNTER — Encounter: Payer: Self-pay | Admitting: Family

## 2016-07-30 ENCOUNTER — Ambulatory Visit (INDEPENDENT_AMBULATORY_CARE_PROVIDER_SITE_OTHER): Payer: Medicare Other | Admitting: Family

## 2016-07-30 ENCOUNTER — Ambulatory Visit (HOSPITAL_COMMUNITY)
Admission: RE | Admit: 2016-07-30 | Discharge: 2016-07-30 | Disposition: A | Discharge status: Home / Self Care | Payer: Medicare Other | Source: Ambulatory Visit | Attending: Family | Admitting: Family

## 2016-07-30 ENCOUNTER — Encounter: Payer: Self-pay | Admitting: Family

## 2016-07-30 VITALS — BP 143/69 | HR 60 | Ht 64.0 in | Wt 132.0 lb

## 2016-07-30 DIAGNOSIS — I6521 Occlusion and stenosis of right carotid artery: Secondary | ICD-10-CM | POA: Diagnosis not present

## 2016-07-30 DIAGNOSIS — Z9889 Other specified postprocedural states: Secondary | ICD-10-CM | POA: Diagnosis not present

## 2016-07-30 DIAGNOSIS — Z48812 Encounter for surgical aftercare following surgery on the circulatory system: Secondary | ICD-10-CM | POA: Diagnosis not present

## 2016-07-30 DIAGNOSIS — I6523 Occlusion and stenosis of bilateral carotid arteries: Secondary | ICD-10-CM

## 2016-07-30 NOTE — Progress Notes (Signed)
Chief Complaint: Follow up Extracranial Carotid Artery Stenosis   History of Present Illness  Shirley Shea is a 80 y.o. female patient of Dr. Myra GianottiBrabham who returns today for followup.  She is status post left carotid endarterectomy in November 2013 and right carotid endarterectomy in January of 2014. Both were done for asymptomatic high-grade stenosis.  She was seen in between the 2 procedures by ENT; she had weak but functioning vocal cords.  She denies any history of TIA or stroke symptoms.Specifically she denies a history of amaurosis fugax or monocular blindness, unilateral facial drooping, hemiplegia, or receptive or expressive aphasia.  She denies claudication symptoms with walking, denies non healing wounds.  She has SLE (affects her skin) and arthritis, neither of which are disabling for her. She takes Plaquenil for this.   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   Pt denies New Medical or Surgical History.  Pt Diabetic: no Pt smoker: non-smoker  Pt meds include: Statin : Yes ASA: Yes Other anticoagulants/antiplatelets: no     Past Medical History:  Diagnosis Date  . Allergy   . Anxiety   . Arthritis    hands  . CAD (coronary artery disease)   . Carotid artery occlusion   . Diverticulosis   . GERD (gastroesophageal reflux disease)   . Hiatal hernia   . Hyperlipidemia   . Hypertension   . IBS (irritable bowel syndrome)   . Renal artery stenosis [facility](HCC)     Social History Social History  Substance Use Topics  . Smoking status: Never Smoker  . Smokeless tobacco: Never Used     Comment: couple oz wine daily  . Alcohol use No    Family History Family History  Problem Relation Age of Onset  . Hyperlipidemia Mother   . Hypertension Mother   . Diabetes Father   . COPD Father   . Cancer Sister   . Heart attack Sister   . Stroke Son     Surgical History Past Surgical History:  Procedure Laterality Date  . ABDOMINAL HYSTERECTOMY     . BREAST SURGERY     implants removed  . ENDARTERECTOMY  10/09/2012   Procedure: ENDARTERECTOMY CAROTID;  Surgeon: Nada LibmanVance W Brabham, MD;  [facility];  Service: Vascular;  Laterality: Left;  . ENDARTERECTOMY  11/27/2012   Procedure: ENDARTERECTOMY CAROTID;  Surgeon: Nada LibmanVance W Brabham, MD;  [facility];  Service: Vascular;  Laterality: Right;  . EYE SURGERY     tear duct repair   bilateral  . PATCH ANGIOPLASTY  10/09/2012   Procedure: PATCH ANGIOPLASTY;  Surgeon: Nada LibmanVance W Brabham, MD;  [facility];  Service: Vascular;  Laterality: Left;  . PATCH ANGIOPLASTY  11/27/2012   Procedure: PATCH ANGIOPLASTY;  Surgeon: Nada LibmanVance W Brabham, MD;  [facility];  Service: Vascular;  Laterality: Right;  Using Vascu- Guard 1cm x 6cm patch.  . RECTAL PROLAPSE REPAIR     [facility] Dr[facility] Mirian MoGreg Waters  [facility]  March 2015    Allergies  Allergen Reactions  . Codeine Other (See Comments)    crazy  . Ace Inhibitors     cough    Current Outpatient Prescriptions  Medication Sig Dispense Refill  . acetaminophen (TYLENOL) 500 MG tablet Take 500 mg by mouth as needed.    . ALPRAZolam (XANAX) 0.5 MG tablet Take 0.5 mg by mouth daily.     Marland Kitchen. amLODipine (NORVASC) 5 MG tablet Take 5 mg by mouth daily.    .Marland Kitchen  aspirin 81 MG tablet Take 81 mg by mouth daily.    . Calcium Carb-Cholecalciferol (CALCIUM + D3) 600-200 MG-UNIT TABS Take 1 tablet by mouth daily.     . cycloSPORINE (RESTASIS) 0.05 % ophthalmic emulsion Place 1 drop into both eyes at bedtime. Emulsion 1 into affected eye    . diphenhydrAMINE (BENADRYL) 25 mg capsule Take 25 mg by mouth every 6 (six) hours as needed. For allergies    . escitalopram (LEXAPRO) 10 MG tablet Take 10 mg by mouth at bedtime.     Marland Kitchen esomeprazole (NEXIUM) 40 MG capsule Take 40 mg by mouth daily.     . hydroxychloroquine (PLAQUENIL) 200 MG tablet Take 200 mg by mouth daily.    . metoprolol tartrate (LOPRESSOR) 25 MG tablet Take 25 mg by mouth 2 (two) times daily.    .  Multiple Vitamins-Minerals (ICAPS PO) Take 1 tablet by mouth 2 (two) times daily.     . simvastatin (ZOCOR) 20 MG tablet Take 20 mg by mouth every evening.    . zolpidem (AMBIEN) 10 MG tablet Take 5 mg by mouth at bedtime as needed. For sleep     No current facility-administered medications for this visit.     Review of Systems : See HPI for pertinent positives and negatives.  Physical Examination  Vitals:   07/30/16 1059 07/30/16 1102  BP: (!) 148/76 (!) 143/69  Pulse: 60   SpO2: 99%   Weight: 132 lb (59.9 kg)   Height: 5\' 4"  (1.626 m)    Body mass index is 22.66 kg/m.  General: WDWN female in NAD GAIT: normal Eyes: PERRLA Pulmonary: Respirations are non-labored, CTAB, no rales, rhonchi, or wheezing.  Cardiac: regular rhythm, no detected murmur.  VASCULAR EXAM Carotid Bruits Right Left   Negative Negative   Aorta is not palpable. Radial pulses are 2+ palpable and equal.     Gastrointestinal: soft, nontender, BS WNL, no r/g, no palpable masses.  Musculoskeletal: No muscle atrophy/wasting. M/S 5/5 throughout, Extremities without ischemic changes.  Neurologic: A&O X 3; Appropriate Affect, Speech is normal CN 2-12 intact, Pain and light touch intact in extremities, Motor exam as listed above.    Assessment: Shirley Shea is a 80 y.o. female who is status post left carotid endarterectomy in November 2013 and right carotid endarterectomy in January of 2014. Both were done for asymptomatic high-grade stenosis.  She has no history of stroke or TIA.  Fortunately she does not have DM, has never used tobacco, and has a normal BMI. She takes a daily ASA and a statin.   DATA Today's carotid Duplex suggests patent bilateral carotid endarterectomy sites with evidence of mild hyperplasia at the proximal patch sites. Right ECA  stenosis.  No significant change in comparison to the last exam on 07/29/15.   Plan: Follow-up in 1 year with Carotid Duplex scan.   I discussed in depth with the patient the nature of atherosclerosis, and emphasized the importance of maximal medical management including strict control of blood pressure, blood glucose, and lipid levels, obtaining regular exercise, and continued cessation of smoking.  The patient is aware that without maximal medical management the underlying atherosclerotic disease process will progress, limiting the benefit of any interventions. The patient was given information about stroke prevention and what symptoms should prompt the patient to seek immediate medical care. Thank you for allowing [place] to participate in this patient's care.  Charisse March, RN, MSN, FNP-C [facility] Office: [phone]  Clinic  Physician: Myra Gianotti  07/30/16 11:09 AM

## 2016-07-30 NOTE — Patient Instructions (Signed)
Stroke Prevention Some medical conditions and behaviors are associated with an increased chance of having a stroke. You may prevent a stroke by making healthy choices and managing medical conditions. HOW CAN I REDUCE MY RISK OF HAVING A STROKE?   Stay physically active. Get at least 30 minutes of activity on most or all days.  Do not smoke. It may also be helpful to avoid exposure to secondhand smoke.  Limit alcohol use. Moderate alcohol use is considered to be:  No more than 2 drinks per day for men.  No more than 1 drink per day for nonpregnant women.  Eat healthy foods. This involves:  Eating 5 or more servings of fruits and vegetables a day.  Making dietary changes that address high blood pressure (hypertension), high cholesterol, diabetes, or obesity.  Manage your cholesterol levels.  Making food choices that are high in fiber and low in saturated fat, trans fat, and cholesterol may control cholesterol levels.  Take any prescribed medicines to control cholesterol as directed by your health care provider.  Manage your diabetes.  Controlling your carbohydrate and sugar intake is recommended to manage diabetes.  Take any prescribed medicines to control diabetes as directed by your health care provider.  Control your hypertension.  Making food choices that are low in salt (sodium), saturated fat, trans fat, and cholesterol is recommended to manage hypertension.  Ask your health care provider if you need treatment to lower your blood pressure. Take any prescribed medicines to control hypertension as directed by your health care provider.  If you are 18-39 years of age, have your blood pressure checked every 3-5 years. If you are 40 years of age or older, have your blood pressure checked every year.  Maintain a healthy weight.  Reducing calorie intake and making food choices that are low in sodium, saturated fat, trans fat, and cholesterol are recommended to manage  weight.  Stop drug abuse.  Avoid taking birth control pills.  Talk to your health care provider about the risks of taking birth control pills if you are over 35 years old, smoke, get migraines, or have ever had a blood clot.  Get evaluated for sleep disorders (sleep apnea).  Talk to your health care provider about getting a sleep evaluation if you snore a lot or have excessive sleepiness.  Take medicines only as directed by your health care provider.  For some people, aspirin or blood thinners (anticoagulants) are helpful in reducing the risk of forming abnormal blood clots that can lead to stroke. If you have the irregular heart rhythm of atrial fibrillation, you should be on a blood thinner unless there is a good reason you cannot take them.  Understand all your medicine instructions.  Make sure that other conditions (such as anemia or atherosclerosis) are addressed. SEEK IMMEDIATE MEDICAL CARE IF:   You have sudden weakness or numbness of the face, arm, or leg, especially on one side of the body.  Your face or eyelid droops to one side.  You have sudden confusion.  You have trouble speaking (aphasia) or understanding.  You have sudden trouble seeing in one or both eyes.  You have sudden trouble walking.  You have dizziness.  You have a loss of balance or coordination.  You have a sudden, severe headache with no known cause.  You have new chest pain or an irregular heartbeat. Any of these symptoms may represent a serious problem that is an emergency. Do not wait to see if the symptoms will   go away. Get medical help at once. Call your local emergency services (911 in [place] Do not drive yourself to the hospital.   This information is not intended to replace advice given to you by your health care provider. Make sure you discuss any questions you have with your health care provider.   Document Released: 12/13/2004 Document Revised: 11/26/2014 Document Reviewed:  05/08/2013 Elsevier Interactive Patient Education 2016 Elsevier Inc.  

## 2016-07-31 NOTE — Addendum Note (Signed)
Addended by: Melodye PedMANESS-HARRISON, Kawanna Christley C on: 07/31/2016 09:10 AM   Modules accepted: Orders

## 2016-10-19 ENCOUNTER — Other Ambulatory Visit: Payer: Self-pay | Admitting: Internal Medicine

## 2016-10-19 DIAGNOSIS — R6889 Other general symptoms and signs: Secondary | ICD-10-CM

## 2016-10-19 DIAGNOSIS — I701 Atherosclerosis of renal artery: Secondary | ICD-10-CM

## 2016-10-24 NOTE — Progress Notes (Signed)
Patient ID: Shirley Shea, female   DOB: Dec 25, 1935, 80 y.o.   MRN: [id number]     Cardiology Office Note   Date:  10/25/2016   ID:  Shirley LusterShelby J Shea, DOB Dec 25, 1935, MRN [id number]  PCP:  Ralene OkMOREIRA,ROY, MD    No chief complaint on file. f/u PAD, HTN   Wt Readings from Last 3 Encounters:  10/25/16 130 lb (59 kg)  07/30/16 132 lb (59.9 kg)  10/28/15 130 lb (59 kg)       History of Present Illness: Shirley Shea is a 80 y.o. female  who has had HTN and renal artery stenosis. BPs at home range from 100-140 systolic in the past, but not checked recently. SHe walks some at home. She goes up and down stairs without problems. No CP or SHOB. She has some lightheadedness with position cahnges.  She walks 15-20 minutes at a time without difficulty.  In 2013, she had bilateral carotid stenosis diagnosed and had bilateral CEA. She did very well. BP has been controlled since starting Norvasc. No CP, SHOB. She walks a little for exercise. She is not getting 150 minutes/week at this time.    She is getting her routine carotid ultrasounds as well.  Overall, doing well.  No bleeding problems. [facility] and there was some concern about her circulation.  She denies claudication.  No nonhealing sores on her feet.  Unclear to me what the test was [facility].   Follow[facility]    Past Medical History:  Diagnosis Date  . Allergy   . Anxiety   . Arthritis    hands  . CAD (coronary artery disease)   . Carotid artery occlusion   . Diverticulosis   . GERD (gastroesophageal reflux disease)   . Hiatal hernia   . Hyperlipidemia   . Hypertension   . IBS (irritable bowel syndrome)   . Renal artery stenosis [facility](HCC)     Past Surgical History:  Procedure Laterality Date  . ABDOMINAL HYSTERECTOMY    . BREAST SURGERY     implants removed  . ENDARTERECTOMY  10/09/2012   Procedure: ENDARTERECTOMY CAROTID;  Surgeon: Nada LibmanVance W  Brabham, MD;  [facility];  Service: Vascular;  Laterality: Left;  . ENDARTERECTOMY  11/27/2012   Procedure: ENDARTERECTOMY CAROTID;  Surgeon: Nada LibmanVance W Brabham, MD;  [facility];  Service: Vascular;  Laterality: Right;  . EYE SURGERY     tear duct repair   bilateral  . PATCH ANGIOPLASTY  10/09/2012   Procedure: PATCH ANGIOPLASTY;  Surgeon: Nada LibmanVance W Brabham, MD;  [facility];  Service: Vascular;  Laterality: Left;  . PATCH ANGIOPLASTY  11/27/2012   Procedure: PATCH ANGIOPLASTY;  Surgeon: Nada LibmanVance W Brabham, MD;  [facility];  Service: Vascular;  Laterality: Right;  Using Vascu- Guard 1cm x 6cm patch.  . RECTAL PROLAPSE REPAIR     [facility] Mirian MoGreg Waters  [facility]  March 2015     Current Outpatient Prescriptions  Medication Sig Dispense Refill  . acetaminophen (TYLENOL) 500 MG tablet Take 500 mg by mouth as needed for mild pain or headache.     . ALPRAZolam (XANAX) 0.5 MG tablet Take 0.5 mg by mouth daily.     Marland Kitchen. amLODipine (NORVASC) 5 MG tablet Take 5 mg by mouth daily.    Marland Kitchen. aspirin 81 MG tablet Take 81 mg by mouth daily.    . Calcium Carb-Cholecalciferol (  CALCIUM + D3) 600-200 MG-UNIT TABS Take 1 tablet by mouth daily.     . cycloSPORINE (RESTASIS) 0.05 % ophthalmic emulsion Place 1 drop into both eyes at bedtime. Emulsion 1 into affected eye    . diphenhydrAMINE (BENADRYL) 25 mg capsule Take 25 mg by mouth every 6 (six) hours as needed. For allergies    . escitalopram (LEXAPRO) 10 MG tablet Take 10 mg by mouth at bedtime.     Marland Kitchen. esomeprazole (NEXIUM) 40 MG capsule Take 40 mg by mouth daily.     . hydroxychloroquine (PLAQUENIL) 200 MG tablet Take 200 mg by mouth daily.    . metoprolol tartrate (LOPRESSOR) 25 MG tablet Take 25 mg by mouth 2 (two) times daily.    . Multiple Vitamins-Minerals (ICAPS PO) Take 1 tablet by mouth 2 (two) times daily.     . simvastatin (ZOCOR) 20 MG tablet Take 20 mg by mouth every evening.    . zolpidem (AMBIEN) 10 MG tablet Take 5 mg by  mouth at bedtime as needed. For sleep     No current facility-administered medications for this visit.     Allergies:   Codeine and Ace inhibitors    Social History:  The patient  reports that she has never smoked. She has never used smokeless tobacco. She reports that she does not drink alcohol or use drugs.   Family History:  The patient's family history includes COPD in her father; Cancer in her sister; Diabetes in her father; Heart attack in her sister; Hyperlipidemia in her mother; Hypertension in her mother; Stroke in her son.    ROS:  Please see the history of present illness.   Otherwise, review of systems are positive for rare joint pains.   All other systems are reviewed and negative.    PHYSICAL EXAM: VS:  BP 110/72   Pulse (!) 54   Ht 5\' 4"  (1.626 m)   Wt 130 lb (59 kg)   BMI 22.31 kg/m  , BMI Body mass index is 22.31 kg/m. GEN: Well nourished, well developed, in no acute distress  HEENT: normal  Neck: no JVD, carotid bruits, or masses Cardiac: RRR; no murmurs, rubs, or gallops,no edema , 2+ PT pulses bilaterally Respiratory:  clear to auscultation bilaterally, normal work of breathing GI: soft, nontender, nondistended, + BS MS: no deformity or atrophy  Skin: warm and dry, no rash Neuro:  Strength and sensation are intact Psych: euthymic mood, full affect   EKG:   The ekg ordered today demonstrates normal ECG   Recent Labs: No results found for requested labs within last 8760 hours.   Lipid Panel    Component Value Date/Time   CHOL  07/26/2008 0630    175        ATP III CLASSIFICATION:  <200     mg/dL   Desirable  [id number]  mg/dL   Borderline High  >=440>=240    mg/dL   High   TRIG 50 34/74/259509/05/2008 0630   HDL 67 07/26/2008 0630   CHOLHDL 2.6 07/26/2008 0630   VLDL 10 07/26/2008 0630   LDLCALC  07/26/2008 0630    98        Total Cholesterol/HDL:CHD Risk Coronary Heart Disease Risk Table                     Men   Women  1/2 Average Risk   3.4   3.3       Other studies Reviewed: Additional studies/ records that  were reviewed today with results demonstrating: .   ASSESSMENT AND PLAN:  1. Renal artery stenosis: Cr. Stable.  Most recent value 1.26.  BP stable at home. Continue current medicines.  Her labs are followed by her primary care doctor and will be repeated in January.  If rcreatinine rises, would have to consider reimaging of her renal arteries. I encouraged her to stay well hydrated. 2. Hypertension: Continue amlodipine and metoprolol. Readings are well controlled at home. 3. Chest pain: Resolved. Negative stress test in 2015. 4. Hyperlipidemia: Lipids from August 2016 were reviewed. Well controlled. Continue simvastatin. 5. PACs: No symptoms at this time. 6. Carotid artery disease: Following up with vascular surgeons.  Most recent Doppler in 9/17.   Current medicines are reviewed at length with the patient today.  The patient concerns regarding her medicines were addressed.  The following changes have been made:  No change  Labs/ tests ordered today include:  No orders of the defined types were placed in this encounter.   Recommend 150 minutes/week of aerobic exercise Low fat, low carb, high fiber diet recommended  Disposition:   FU in 1 year   Signed, Lance Muss, MD  10/25/2016 12:09 PM    [facility] [address] [place], [place], [place]  [address] Phone: [phone]; Fax: [phone]

## 2016-10-25 ENCOUNTER — Encounter: Payer: Self-pay | Admitting: Interventional Cardiology

## 2016-10-25 ENCOUNTER — Ambulatory Visit (INDEPENDENT_AMBULATORY_CARE_PROVIDER_SITE_OTHER): Payer: Medicare Other | Admitting: Interventional Cardiology

## 2016-10-25 VITALS — BP 110/72 | HR 54 | Ht 64.0 in | Wt 130.0 lb

## 2016-10-25 DIAGNOSIS — I491 Atrial premature depolarization: Secondary | ICD-10-CM

## 2016-10-25 DIAGNOSIS — I779 Disorder of arteries and arterioles, unspecified: Secondary | ICD-10-CM | POA: Diagnosis not present

## 2016-10-25 DIAGNOSIS — I10 Essential (primary) hypertension: Secondary | ICD-10-CM

## 2016-10-25 DIAGNOSIS — I701 Atherosclerosis of renal artery: Secondary | ICD-10-CM

## 2016-10-25 DIAGNOSIS — I739 Peripheral vascular disease, unspecified: Principal | ICD-10-CM

## 2016-10-25 NOTE — Patient Instructions (Signed)

## 2016-10-26 ENCOUNTER — Other Ambulatory Visit: Payer: Medicare Other

## 2016-11-01 ENCOUNTER — Ambulatory Visit
Admission: RE | Admit: 2016-11-01 | Discharge: 2016-11-01 | Disposition: A | Discharge status: Home / Self Care | Payer: Medicare Other | Source: Ambulatory Visit | Attending: Internal Medicine | Admitting: Internal Medicine

## 2016-11-01 DIAGNOSIS — I701 Atherosclerosis of renal artery: Secondary | ICD-10-CM

## 2016-11-01 DIAGNOSIS — R6889 Other general symptoms and signs: Secondary | ICD-10-CM

## 2017-08-06 ENCOUNTER — Ambulatory Visit: Payer: Medicare Other | Admitting: Family

## 2017-08-06 ENCOUNTER — Encounter (HOSPITAL_COMMUNITY): Payer: Medicare Other

## 2017-08-07 ENCOUNTER — Ambulatory Visit (HOSPITAL_COMMUNITY)
Admission: RE | Admit: 2017-08-07 | Discharge: 2017-08-07 | Disposition: A | Discharge status: Home / Self Care | Payer: Medicare Other | Source: Ambulatory Visit | Attending: Family | Admitting: Family

## 2017-08-07 ENCOUNTER — Ambulatory Visit (INDEPENDENT_AMBULATORY_CARE_PROVIDER_SITE_OTHER): Payer: Medicare Other | Admitting: Family

## 2017-08-07 ENCOUNTER — Encounter: Payer: Self-pay | Admitting: Family

## 2017-08-07 VITALS — BP 139/49 | HR 53 | Temp 97.9°F | Resp 16 | Ht 65.0 in | Wt 132.7 lb

## 2017-08-07 DIAGNOSIS — Z9889 Other specified postprocedural states: Secondary | ICD-10-CM

## 2017-08-07 DIAGNOSIS — Z48812 Encounter for surgical aftercare following surgery on the circulatory system: Secondary | ICD-10-CM | POA: Insufficient documentation

## 2017-08-07 DIAGNOSIS — I6523 Occlusion and stenosis of bilateral carotid arteries: Secondary | ICD-10-CM | POA: Insufficient documentation

## 2017-08-07 LAB — VAS US CAROTID
LCCADSYS: -84 cm/s
LCCAPDIAS: 23 cm/s
LCCAPSYS: 96 cm/s
LEFT ECA DIAS: -13 cm/s
LEFT VERTEBRAL DIAS: -17 cm/s
LICADSYS: -82 cm/s
LICAPSYS: -91 cm/s
Left CCA dist dias: -17 cm/s
Left ICA dist dias: -28 cm/s
Left ICA prox dias: -28 cm/s
RCCAPSYS: 76 cm/s
RIGHT CCA MID DIAS: 18 cm/s
RIGHT ECA DIAS: -104 cm/s
RIGHT VERTEBRAL DIAS: -16 cm/s
Right CCA prox dias: 15 cm/s
Right cca dist sys: -157 cm/s

## 2017-08-07 NOTE — Patient Instructions (Addendum)
Stroke Prevention Some medical conditions and behaviors are associated with an increased chance of having a stroke. You may prevent a stroke by making healthy choices and managing medical conditions. How can I reduce my risk of having a stroke?  Stay physically active. Get at least 30 minutes of activity on most or all days.  Do not smoke. It may also be helpful to avoid exposure to secondhand smoke.  Limit alcohol use. Moderate alcohol use is considered to be: ? No more than 2 drinks per day for men. ? No more than 1 drink per day for nonpregnant women.  Eat healthy foods. This involves: ? Eating 5 or more servings of fruits and vegetables a day. ? Making dietary changes that address high blood pressure (hypertension), high cholesterol, diabetes, or obesity.  Manage your cholesterol levels. ? Making food choices that are high in fiber and low in saturated fat, trans fat, and cholesterol may control cholesterol levels. ? Take any prescribed medicines to control cholesterol as directed by your health care provider.  Manage your diabetes. ? Controlling your carbohydrate and sugar intake is recommended to manage diabetes. ? Take any prescribed medicines to control diabetes as directed by your health care provider.  Control your hypertension. ? Making food choices that are low in salt (sodium), saturated fat, trans fat, and cholesterol is recommended to manage hypertension. ? Ask your health care provider if you need treatment to lower your blood pressure. Take any prescribed medicines to control hypertension as directed by your health care provider. ? If you are 18-39 years of age, have your blood pressure checked every 3-5 years. If you are 40 years of age or older, have your blood pressure checked every year.  Maintain a healthy weight. ? Reducing calorie intake and making food choices that are low in sodium, saturated fat, trans fat, and cholesterol are recommended to manage  weight.  Stop drug abuse.  Avoid taking birth control pills. ? Talk to your health care provider about the risks of taking birth control pills if you are over 35 years old, smoke, get migraines, or have ever had a blood clot.  Get evaluated for sleep disorders (sleep apnea). ? Talk to your health care provider about getting a sleep evaluation if you snore a lot or have excessive sleepiness.  Take medicines only as directed by your health care provider. ? For some people, aspirin or blood thinners (anticoagulants) are helpful in reducing the risk of forming abnormal blood clots that can lead to stroke. If you have the irregular heart rhythm of atrial fibrillation, you should be on a blood thinner unless there is a good reason you cannot take them. ? Understand all your medicine instructions.  Make sure that other conditions (such as anemia or atherosclerosis) are addressed. Get help right away if:  You have sudden weakness or numbness of the face, arm, or leg, especially on one side of the body.  Your face or eyelid droops to one side.  You have sudden confusion.  You have trouble speaking (aphasia) or understanding.  You have sudden trouble seeing in one or both eyes.  You have sudden trouble walking.  You have dizziness.  You have a loss of balance or coordination.  You have a sudden, severe headache with no known cause.  You have new chest pain or an irregular heartbeat. Any of these symptoms may represent a serious problem that is an emergency. Do not wait to see if the symptoms will go away.   Get medical help at once. Call your local emergency services (911 in [place] Do not drive yourself to the hospital. This information is not intended to replace advice given to you by your health care provider. Make sure you discuss any questions you have with your health care provider. Document Released: 12/13/2004 Document Revised: 04/12/2016 Document Reviewed: 05/08/2013 Elsevier  Interactive Patient Education  2017 Elsevier Inc.     Preventing Cerebrovascular Disease Arteries are blood vessels that carry blood that contains oxygen from the heart to all parts of the body. Cerebrovascular disease affects arteries that supply the brain. Any condition that blocks or disrupts blood flow to the brain can cause cerebrovascular disease. Brain cells that lose blood supply start to die within minutes (stroke). Stroke is the main danger of cerebrovascular disease. Atherosclerosis and high blood pressure are common causes of cerebrovascular disease. Atherosclerosis is narrowing and hardening of an artery that results when fat, cholesterol, calcium, or other substances (plaque) build up inside an artery. Plaque reduces blood flow through the artery. High blood pressure increases the risk of bleeding inside the brain. Making diet and lifestyle changes to prevent atherosclerosis and high blood pressure lowers your risk of cerebrovascular disease. What nutrition changes can be made?  Eat more fruits, vegetables, and whole grains.  Reduce how much saturated fat you eat. To do this, eat less red meat and fewer full-fat dairy products.  Eat healthy proteins instead of red meat. Healthy proteins include: ? Fish. Eat fish that contains heart-healthy omega-3 fatty acids, twice a week. Examples include salmon, albacore tuna, mackerel, and herring. ? Chicken. ? Nuts. ? Low-fat or nonfat yogurt.  Avoid processed meats, like bacon and lunchmeat.  Avoid foods that contain: ? A lot of sugar, such as sweets and drinks with added sugar. ? A lot of salt (sodium). Avoid adding extra salt to your food, as told by your health care provider. ? Trans fats, such as margarine and baked goods. Trans fats may be listed as "partially hydrogenated oils" on food labels.  Check food labels to see how much sodium, sugar, and trans fats are in foods.  Use vegetable oils that contain low amounts of  saturated fat, such as olive oil or canola oil. What lifestyle changes can be made?  Drink alcohol in moderation. This means no more than 1 drink a day for nonpregnant women and 2 drinks a day for men. One drink equals 12 oz of beer, 5 oz of wine, or 1 oz of hard liquor.  If you are overweight, ask your health care provider to recommend a weight-loss plan for you. Losing 5-10 lb (2.2-4.5 kg) can reduce your risk of diabetes, atherosclerosis, and high blood pressure.  Exercise for 30?60 minutes on most days, or as much as told by your health care provider. ? Do moderate-intensity exercise, such as brisk walking, bicycling, and water aerobics. Ask your health care provider which activities are safe for you.  Do not use any products that contain nicotine or tobacco, such as cigarettes and e-cigarettes. If you need help quitting, ask your health care provider. Why are these changes important? Making these changes lowers your risk of many diseases that can cause cerebrovascular disease and stroke. Stroke is a leading cause of death and disability. Making these changes also improves your overall health and quality of life. What can I do to lower my risk? The following factors make you more likely to develop cerebrovascular disease:  Being overweight.  Smoking.  Being physically inactive.    Eating a high-fat diet.  Having certain health conditions, such as: ? Diabetes. ? High blood pressure. ? Heart disease. ? Atherosclerosis. ? High cholesterol. ? Sickle cell disease.  Talk with your health care provider about your risk for cerebrovascular disease. Work with your health care provider to control diseases that you have that may contribute to cerebrovascular disease. Your health care provider may prescribe medicines to help prevent major causes of cerebrovascular disease. Where to find more information: Learn more about preventing cerebrovascular disease from:  National Heart, Lung, and  Blood Institute: www.nhlbi.nih.gov/health/health-topics/topics/stroke  Centers for Disease Control and Prevention: cdc.gov/stroke/about.htm  Summary  Cerebrovascular disease can lead to a stroke.  Atherosclerosis and high blood pressure are major causes of cerebrovascular disease.  Making diet and lifestyle changes can reduce your risk of cerebrovascular disease.  Work with your health care provider to get your risk factors under control to reduce your risk of cerebrovascular disease. This information is not intended to replace advice given to you by your health care provider. Make sure you discuss any questions you have with your health care provider. Document Released: 11/20/2015 Document Revised: 05/25/2016 Document Reviewed: 11/20/2015 Elsevier Interactive Patient Education  2018 Elsevier Inc.  

## 2017-08-07 NOTE — Progress Notes (Signed)
Chief Complaint: Follow up Extracranial Carotid Artery Stenosis   History of Present Illness  Shirley Shea is a 81 y.o. female patient of Dr. Myra Gianotti who returns today for followup. She is status post left carotid endarterectomy in November 2013 and right carotid endarterectomy in January of 2014. Both were done for asymptomatic high-grade stenosis.  She was seen in between the two CEA's by ENT; she had weak but functioning vocal cords.  She denies any history of TIA or stroke symptoms.Specifically she denies a history of amaurosis fugax or monocular blindness, unilateral facial drooping, hemiplegia, or receptive or expressive aphasia.  She denies claudication symptoms with walking, denies non healing wounds.  She has SLE (affects her skin) and arthritis, neither of which are disabling for her. She takes Plaquenil for this.   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    Pt Diabetic: no Pt smoker: non-smoker  Pt meds include: Statin : Yes ASA: Yes Other anticoagulants/antiplatelets: no    Past Medical History:  Diagnosis Date  . Allergy   . Anxiety   . Arthritis    hands  . CAD (coronary artery disease)   . Carotid artery occlusion   . Diverticulosis   . GERD (gastroesophageal reflux disease)   . Hiatal hernia   . Hyperlipidemia   . Hypertension   . IBS (irritable bowel syndrome)   . Renal artery stenosis [facility])     Social History Social History  Substance Use Topics  . Smoking status: Never Smoker  . Smokeless tobacco: Never Used     Comment: couple oz wine daily  . Alcohol use No    Family History Family History  Problem Relation Age of Onset  . Hyperlipidemia Mother   . Hypertension Mother   . Diabetes Father   . COPD Father   . Cancer Sister   . Heart attack Sister   . Stroke Son     Surgical History Past Surgical History:  Procedure Laterality Date  . ABDOMINAL HYSTERECTOMY    . BREAST SURGERY     implants removed  .  ENDARTERECTOMY  10/09/2012   Procedure: ENDARTERECTOMY CAROTID;  Surgeon: Nada Libman, MD;  Location[facility];  Service: Vascular;  Laterality: Left;  . ENDARTERECTOMY  11/27/2012   Procedure: ENDARTERECTOMY CAROTID;  Surgeon: Nada Libman, MD;  Location: [facility] OR;  Service: Vascular;  Laterality: Right;  . EYE SURGERY     tear duct repair   bilateral  . PATCH ANGIOPLASTY  10/09/2012   Procedure: PATCH ANGIOPLASTY;  Surgeon: Nada Libman, MD;  Location: [facility] OR;  Service: Vascular;  Laterality: Left;  . PATCH ANGIOPLASTY  11/27/2012   Procedure: PATCH ANGIOPLASTY;  Surgeon: Nada Libman, MD;  Location: [facility] OR;  Service: Vascular;  Laterality: Right;  Using Vascu- Guard 1cm x 6cm patch.  . RECTAL PROLAPSE REPAIR     [facility] Dr. Mirian Mo  . [facility]  March 2015    Allergies  Allergen Reactions  . Codeine Other (See Comments)    crazy  . Ace Inhibitors     cough    Current Outpatient Prescriptions  Medication Sig Dispense Refill  . acetaminophen (TYLENOL) 500 MG tablet Take 500 mg by mouth as needed for mild pain or headache.     . ALPRAZolam (XANAX) 0.5 MG tablet Take 0.5 mg by mouth daily.     Marland Kitchen amLODipine (NORVASC) 5 MG tablet Take 5 mg by mouth daily.    Marland Kitchen  aspirin 81 MG tablet Take 81 mg by mouth daily.    . Calcium Carb-Cholecalciferol (CALCIUM + D3) 600-200 MG-UNIT TABS Take 1 tablet by mouth daily.     . cycloSPORINE (RESTASIS) 0.05 % ophthalmic emulsion Place 1 drop into both eyes at bedtime. Emulsion 1 into affected eye    . diphenhydrAMINE (BENADRYL) 25 mg capsule Take 25 mg by mouth every 6 (six) hours as needed. For allergies    . escitalopram (LEXAPRO) 10 MG tablet Take 10 mg by mouth at bedtime.     Marland Kitchen esomeprazole (NEXIUM) 40 MG capsule Take 40 mg by mouth daily.     . hydroxychloroquine (PLAQUENIL) 200 MG tablet Take 200 mg by mouth daily.    . metoprolol tartrate (LOPRESSOR) 25 MG tablet Take 25 mg by mouth 2 (two) times daily.    . Multiple  Vitamins-Minerals (ICAPS PO) Take 1 tablet by mouth 2 (two) times daily.     . simvastatin (ZOCOR) 20 MG tablet Take 20 mg by mouth every evening.    . zolpidem (AMBIEN) 10 MG tablet Take 5 mg by mouth at bedtime as needed. For sleep     No current facility-administered medications for this visit.     Review of Systems : See HPI for pertinent positives and negatives.  Physical Examination  Vitals:   08/07/17 0922 08/07/17 0923  BP: 138/61 (!) 139/49  Pulse: (!) 53   Resp: 16   Temp: 97.9 F (36.6 C)   TempSrc: Oral   SpO2: 99%   Weight: 132 lb 11.2 oz (60.2 kg)   Height:  (1.651 m)    Body mass index is 22.08 kg/m.  General: WDWN female in NAD GAIT:normal Eyes: PERRLA Pulmonary: Respirations are non-labored, CTAB, no rales, rhonchi, or wheezing.  Cardiac: regular rhythm, no detected murmur.  VASCULAR EXAM Carotid Bruits Right Left   Negative Negative    Abdominal aortic pulse is not palpable. Radial pulses are 2+ palpable and equal. Bilateral PT pedal pulses are 1+ palpable.     Gastrointestinal: soft, nontender, BS WNL, no r/g, no palpable masses.  Musculoskeletal: No muscle atrophy/wasting. M/S 4/5 throughout, Extremities without ischemic changes.  Neurologic: A&O X 3; Appropriate Affect, speech is normal, CN 2-12 intact, Pain and light touch intact in extremities, Motor exam as listed above.     Assessment: Shirley Shea is a 81 y.o. female whois status post left carotid endarterectomy in November 2013 and right carotid endarterectomy in January of 2014. Both were done for asymptomatic high-grade stenosis.  She has no history of stroke or TIA.  Fortunately she does not have DM, has never used tobacco, and has a normal BMI. She takes a daily ASA and a statin.    DATA Carotid Duplex (08-07-17): Bilateral  carotid endarterectomy sites with evidence of mild hyperplasia at the proximal patch sites, 1-39% stenoses. Right ECA stenosis. Bilateral vertebral artery flow is antegrade.  Bilateral subclavian artery waveforms are normal.  No significant change in comparison to the exams on 07/29/15 and 07/30/16.    Plan:  Follow-up in 1 year with Carotid Duplex scan.   I discussed in depth with the patient the nature of atherosclerosis, and emphasized the importance of maximal medical management including strict control of blood pressure, blood glucose, and lipid levels, obtaining regular exercise, and continued cessation of smoking.  The patient is aware that without maximal medical management the underlying atherosclerotic disease process will progress, limiting the benefit of any interventions. The patient was given  information about stroke prevention and what symptoms should prompt the patient to seek immediate medical care. Thank you for allowing [place] to participate in this patient's care.  Charisse March, RN, MSN, FNP-C [facility] Office: [phone]  Clinic Physician: Edilia Bo  08/07/17 9:30 AM

## 2017-10-16 NOTE — Addendum Note (Signed)
Addended by: Burton ApleyPETTY, Suni Jarnagin A on: 10/16/2017 11:12 AM   Modules accepted: Orders

## 2017-11-05 NOTE — Progress Notes (Signed)
Cardiology Office Note   Date:  11/06/2017   ID:  Shirley Shea, DOB 1936-05-21, MRN [id number]  PCP:  Ralene OkMoreira, Roy, MD    No chief complaint on file.  Renal artery stenosis  Wt Readings from Last 3 Encounters:  11/06/17 132 lb 12.8 oz (60.2 kg)  08/07/17 132 lb 11.2 oz (60.2 kg)  10/25/16 130 lb (59 kg)       History of Present Illness: Shirley Shea is a 81 y.o. female  who has had HTN and renal artery stenosis.  In 2013, she had bilateral carotid stenosis diagnosed and had bilateral CEA. She did very well.  She is getting her routine carotid ultrasounds as well.  Denies : Chest pain. Dizziness. Leg edema. Nitroglycerin use. Orthopnea. Palpitations. Paroxysmal nocturnal dyspnea. Shortness of breath. Syncope.   Home BP readings have been well controlled for the most part, typically in the 1201-130 range systolic.  Now off of Niacin.  LDL increased from May 2018 to 12/18 from 77 to 103.  She is eating frozen dinners and more pasta for convenience.   Past Medical History:  Diagnosis Date  . Allergy   . Anxiety   . Arthritis    hands  . CAD (coronary artery disease)   . Carotid artery occlusion   . Diverticulosis   . GERD (gastroesophageal reflux disease)   . Hiatal hernia   . Hyperlipidemia   . Hypertension   . IBS (irritable bowel syndrome)   . Renal artery stenosis [facility](H[facility])     Past Surgical History:  Procedure Laterality Date  . ABDOMINAL HYSTERECTOMY    . BREAST SURGERY     implants removed  . ENDARTERECTOMY  10/09/2012   Procedure: ENDARTERECTOMY CAROTID;  Surgeon: Nada LibmanVance W Brabham, MD;  [facility];  Service: Vascular;  Laterality: Left;  . ENDARTERECTOMY  11/27/2012   Procedure: ENDARTERECTOMY CAROTID;  Surgeon: Nada LibmanVance W Brabham, MD;  [facility];  Service: Vascular;  Laterality: Right;  . EYE SURGERY     tear duct repair   bilateral  . PATCH ANGIOPLASTY  10/09/2012   Procedure: PATCH ANGIOPLASTY;  Surgeon: Nada LibmanVance W Brabham, MD;  Location:  [facility] OR;  Service: Vascular;  Laterality: Left;  . PATCH ANGIOPLASTY  11/27/2012   Procedure: PATCH ANGIOPLASTY;  Surgeon: Nada LibmanVance W Brabham, MD;  [facility];  Service: Vascular;  Laterality: Right;  Using Vascu- Guard 1cm x 6cm patch.  . RECTAL PROLAPSE REPAIR     [facility] Dr[facility] Mir[facility] MoGreg Waters  [facility]  March [facility]     Current Outpatient Medications  Medication Sig Dispense Refill  . acetaminophen (TYLENOL) 500 MG tablet Take 500 mg by mouth as needed for mild pain or headache.     . ALPRAZolam (XANAX) 0.5 MG tablet Take 0.5 mg by mouth daily.     Marland Kitchen. amLODipine (NORVASC) 5 MG tablet Take 5 mg by mouth daily.    Marland Kitchen. aspirin 81 MG tablet Take 81 mg by mouth daily.    . Calcium Carb-Cholecalciferol (CALCIUM + D3) 600-200 MG-UNIT TABS Take 1 tablet by mouth daily.     . cycloSPORINE (RESTASIS) 0.05 % ophthalmic emulsion Place 1 drop into both eyes at bedtime. Emulsion 1 into affected eye    . diphenhydrAMINE (BENADRYL) 25 mg capsule Take 25 mg by mouth every 6 (six) hours as needed. For allergies    . escitalopram (LEXAPRO) 10 MG tablet Take 10 mg by mouth at bedtime.     Marland Kitchen. esomeprazole (  NEXIUM) 40 MG capsule Take 40 mg by mouth daily.     . hydroxychloroquine (PLAQUENIL) 200 MG tablet Take 200 mg by mouth daily.    . metoprolol tartrate (LOPRESSOR) 25 MG tablet Take 25 mg by mouth 2 (two) times daily.    . Multiple Vitamins-Minerals (ICAPS PO) Take 1 tablet by mouth 2 (two) times daily.     . simvastatin (ZOCOR) 20 MG tablet Take 20 mg by mouth every evening.    . zolpidem (AMBIEN) 10 MG tablet Take 5 mg by mouth at bedtime as needed. For sleep     No current facility-administered medications for this visit.     Allergies:   Codeine and Ace inhibitors    Social History:  The patient  reports that  has never smoked. she has never used smokeless tobacco. She reports that she does not drink alcohol or use drugs.   Family History:  The patient's family history includes COPD in her  father; Cancer in her sister; Diabetes in her father; Heart attack in her sister; Hyperlipidemia in her mother; Hypertension in her mother; Stroke in her son.    ROS:  Please see the history of present illness.   Otherwise, review of systems are positive for DOE.   All other systems are reviewed and negative.    PHYSICAL EXAM: VS:  BP 110/76   Pulse (!) 59   Ht 5\' 4"  (1.626 m)   Wt 132 lb 12.8 oz (60.2 kg)   SpO2 99%   BMI 22.80 kg/m  , BMI Body mass index is 22.8 kg/m. GEN: Well nourished, well developed, in no acute distress  HEENT: normal  Neck: no JVD, carotid bruits, or masses Cardiac: RRR; no murmurs, rubs, or gallops,no edema  Respiratory:  clear to auscultation bilaterally, normal work of breathing GI: soft, nontender, nondistended, + BS MS: no deformity or atrophy  Skin: warm and dry, no rash Neuro:  Strength and sensation are intact Psych: euthymic mood, full affect   EKG:   The ekg ordered today demonstrates NSR, no ST changes   Recent Labs: No results found for requested labs within last 8760 hours.   Lipid Panel    Component Value Date/Time   CHOL  07/26/2008 0630    175        ATP III CLASSIFICATION:  <200     mg/dL   Desirable  161-096  mg/dL   Borderline High  >=045    mg/dL   High   TRIG 50 40/98/1191 0630   HDL 67 07/26/2008 0630   CHOLHDL 2.6 07/26/2008 0630   VLDL 10 07/26/2008 0630   LDLCALC  07/26/2008 0630    98        Total Cholesterol/HDL:CHD Risk Coronary Heart Disease Risk Table                     Men   Women  1/2 Average Risk   3.4   3.3     Other studies Reviewed: Additional studies/ records that were reviewed today with results demonstrating: Normal EF in 2012, moderate MR.   ASSESSMENT AND PLAN:  1. RAS: BP controlled.  Cr. Stable on recent labs.  1.3 2. Hyperlipidemia: Switch simvastatin to atorvastatin 20 mg daily.  3. HTN: COntrolled.  Continue current meds.  4. PACs: No sx. 5. Carotid artery disease: bilateral CEA.  Now getting regular ultrasounds. 6. Mitral regurg: noted in the past.  Will check echo given some DOE, which she attributes  to aging.     Current medicines are reviewed at length with the patient today.  The patient concerns regarding her medicines were addressed.  The following changes have been made:  No change  Labs/ tests ordered today include:  No orders of the defined types were placed in this encounter.   Recommend 150 minutes/week of aerobic exercise Low fat, low carb, high fiber diet recommended  Disposition:   FU in 1 year   Signed, Lance MussJayadeep Varanasi, MD  11/06/2017 10:24 AM    [facility] [address], [place], [place]  [address] Phone: [phone]; Fax: [phone]

## 2017-11-06 ENCOUNTER — Encounter: Payer: Self-pay | Admitting: Interventional Cardiology

## 2017-11-06 ENCOUNTER — Ambulatory Visit: Payer: Medicare Other | Admitting: Interventional Cardiology

## 2017-11-06 VITALS — BP 110/76 | HR 59 | Ht 64.0 in | Wt 132.8 lb

## 2017-11-06 DIAGNOSIS — I340 Nonrheumatic mitral (valve) insufficiency: Secondary | ICD-10-CM

## 2017-11-06 DIAGNOSIS — I491 Atrial premature depolarization: Secondary | ICD-10-CM | POA: Diagnosis not present

## 2017-11-06 DIAGNOSIS — I10 Essential (primary) hypertension: Secondary | ICD-10-CM

## 2017-11-06 DIAGNOSIS — I701 Atherosclerosis of renal artery: Secondary | ICD-10-CM

## 2017-11-06 DIAGNOSIS — E785 Hyperlipidemia, unspecified: Secondary | ICD-10-CM

## 2017-11-06 DIAGNOSIS — Z9889 Other specified postprocedural states: Secondary | ICD-10-CM

## 2017-11-06 DIAGNOSIS — R0602 Shortness of breath: Secondary | ICD-10-CM | POA: Diagnosis not present

## 2017-11-06 MED ORDER — ATORVASTATIN CALCIUM 20 MG PO TABS: 20.0000 mg | ORAL_TABLET | Freq: Every day | ORAL | 3 refills | Status: DC

## 2017-11-06 NOTE — Patient Instructions (Signed)
Medication Instructions:  Your physician has recommended you make the following change in your medication:   STOP: simvastatin  START: atorvastatin 20 mg daily  Labwork: None ordered  Testing/Procedures: Your physician has requested that you have an echocardiogram. Echocardiography is a painless test that uses sound waves to create images of your heart. It provides your doctor with information about the size and shape of your heart and how well your heart's chambers and valves are working. This procedure takes approximately one hour. There are no restrictions for this procedure.  Follow-Up: Your physician wants you to follow-up in: 1 year with Dr. Eldridge DaceVaranasi. You will receive a reminder letter in the mail two months in advance. If you don't receive a letter, please call our office to schedule the follow-up appointment.   Any Other Special Instructions Will Be Listed Below (If Applicable).     If you need a refill on your cardiac medications before your next appointment, please call your pharmacy.

## 2017-12-02 ENCOUNTER — Other Ambulatory Visit: Payer: Self-pay

## 2017-12-02 ENCOUNTER — Ambulatory Visit (HOSPITAL_COMMUNITY): Payer: Medicare Other | Attending: Interventional Cardiology

## 2017-12-02 DIAGNOSIS — R0602 Shortness of breath: Secondary | ICD-10-CM

## 2017-12-02 DIAGNOSIS — I051 Rheumatic mitral insufficiency: Secondary | ICD-10-CM | POA: Insufficient documentation

## 2017-12-09 ENCOUNTER — Telehealth: Payer: Self-pay | Admitting: Interventional Cardiology

## 2017-12-09 NOTE — Telephone Encounter (Signed)
Left message for patient to call back  

## 2017-12-09 NOTE — Telephone Encounter (Signed)
New message  Patient returning phone call from 12/03/2017

## 2017-12-10 NOTE — Telephone Encounter (Signed)
Left detailed message of patient's echo results on VM (DPR on file). Instructed for patient to call back with any questions.

## 2017-12-10 NOTE — Telephone Encounter (Signed)
-----   Message from Corky CraftsJayadeep S Varanasi, MD sent at 12/02/2017  6:16 PM EST ----- Normal LV function.  Adequate valvular function.

## 2018-10-20 ENCOUNTER — Encounter: Payer: Self-pay | Admitting: Interventional Cardiology

## 2018-10-27 NOTE — Progress Notes (Signed)
Cardiology Office Note   Date:  10/29/2018   ID:  Shirley Shea, DOB 06-24-1936, MRN [id number]  PCP:  Ralene OkMoreira, Roy, MD    No chief complaint on file.  PAD  Wt Readings from Last 3 Encounters:  10/29/18 131 lb 3.2 oz (59.5 kg)  11/06/17 132 lb 12.8 oz (60.2 kg)  08/07/17 132 lb 11.2 oz (60.2 kg)       History of Present Illness: Shirley Shea is a 82 y.o. female   who has had HTN and renal artery stenosis.  In 2013, she had bilateral carotid stenosis diagnosed and had bilateral CEA. She did very well.  She is getting her routine carotid ultrasounds as well.  In 2018, stopped Niacin.  LDL increased from May 2018 to 12/18 from 77 to 103.  She is eating frozen dinners and more pasta for convenience.   Diet is stable now.  Snacks in the afternoon.  No eating after 6:30 PM.  One main meal is lunch/brunch.  Sticking to more natural foods, especially in the summer.   Denies : Chest pain. Dizziness. Leg edema. Nitroglycerin use. Orthopnea. Palpitations. Paroxysmal nocturnal dyspnea. Shortness of breath. Syncope.   Walks 20-30 minutes/day when weather allows. Also some stretching a floor exercises in the house.     Past Medical History:  Diagnosis Date  . Allergy   . Anxiety   . Arthritis    hands  . CAD (coronary artery disease)   . Carotid artery occlusion   . Diverticulosis   . GERD (gastroesophageal reflux disease)   . Hiatal hernia   . Hyperlipidemia   . Hypertension   . IBS (irritable bowel syndrome)   . Renal artery stenosis [facility](HCC)     Past Surgical History:  Procedure Laterality Date  . ABDOMINAL HYSTERECTOMY    . BREAST SURGERY     implants removed  . ENDARTERECTOMY  10/09/2012   Procedure: ENDARTERECTOMY CAROTID;  Surgeon: Nada LibmanVance W Brabham, MD;  [facility];  Service: Vascular;  Laterality: Left;  . ENDARTERECTOMY  11/27/2012   Procedure: ENDARTERECTOMY CAROTID;  Surgeon: Nada LibmanVance W Brabham, MD;  [facility];  Service: Vascular;   Laterality: Right;  . EYE SURGERY     tear duct repair   bilateral  . PATCH ANGIOPLASTY  10/09/2012   Procedure: PATCH ANGIOPLASTY;  Surgeon: Nada LibmanVance W Brabham, MD;  [facility]: [facility] OR;  Service: Vascular;  Laterality: Left;  . PATCH ANGIOPLASTY  11/27/2012   Procedure: PATCH ANGIOPLASTY;  Surgeon: Nada LibmanVance W Brabham, MD;  [facility];  Service: Vascular;  Laterality: Right;  Using Vascu- Guard 1cm x 6cm patch.  . RECTAL PROLAPSE REPAIR     [facility]Greg Waters  [facility]  [facility]     Current Outpatient Medications  Medication Sig Dispense Refill  . acetaminophen (TYLENOL) 500 MG tablet Take 500 mg by mouth as needed for mild pain or headache.     . ALPRAZolam (XANAX) 0.5 MG tablet Take 0.5 mg by mouth daily.     Marland Kitchen. amLODipine (NORVASC) 5 MG tablet Take 5 mg by mouth daily.    Marland Kitchen. aspirin 81 MG tablet Take 81 mg by mouth daily.    Marland Kitchen. atorvastatin (LIPITOR) 20 MG tablet Take 1 tablet (20 mg total) by mouth daily. 90 tablet 3  . Calcium Carb-Cholecalciferol (CALCIUM + D3) 600-200 MG-UNIT TABS Take 1 tablet by mouth daily.     . cycloSPORINE (RESTASIS) 0.05 % ophthalmic emulsion Place 1 drop  into both eyes at bedtime. Emulsion 1 into affected eye    . diphenhydrAMINE (BENADRYL) 25 mg capsule Take 25 mg by mouth every 6 (six) hours as needed. For allergies    . escitalopram (LEXAPRO) 10 MG tablet Take 10 mg by mouth at bedtime.     . hydroxychloroquine (PLAQUENIL) 200 MG tablet Take 200 mg by mouth daily.    . metoprolol tartrate (LOPRESSOR) 25 MG tablet Take 25 mg by mouth 2 (two) times daily.    . Multiple Vitamins-Minerals (ICAPS PO) Take 1 tablet by mouth 2 (two) times daily.     Marland Kitchen omeprazole (PRILOSEC) 20 MG capsule   5  . zolpidem (AMBIEN) 10 MG tablet Take 5 mg by mouth at bedtime as needed. For sleep     No current facility-administered medications for this visit.     Allergies:   Codeine and Ace inhibitors    Social History:  The patient  reports that she has never  smoked. She has never used smokeless tobacco. She reports that she does not drink alcohol or use drugs.   Family History:  The patient's family history includes COPD in her father; Cancer in her sister; Diabetes in her father; Heart attack in her sister; Hyperlipidemia in her mother; Hypertension in her mother; Stroke in her son.    ROS:  Please see the history of present illness.   Otherwise, review of systems are positive for recent cold.   All other systems are reviewed and negative.    PHYSICAL EXAM: VS:  BP 140/64   Pulse 65   Ht 5\' 4"  (1.626 m)   Wt 131 lb 3.2 oz (59.5 kg)   SpO2 95%   BMI 22.52 kg/m  , BMI Body mass index is 22.52 kg/m. GEN: Well nourished, well developed, in no acute distress  HEENT: normal  Neck: no JVD, carotid bruits, or masses Cardiac: RRR; no murmurs, rubs, or gallops,no edema  Respiratory:  clear to auscultation bilaterally, normal work of breathing GI: soft, nontender, nondistended, + BS MS: no deformity or atrophy  Skin: warm and dry, no rash Neuro:  Strength and sensation are intact Psych: euthymic mood, full affect   EKG:   The ekg ordered today demonstrates NSR, no ST changes   Recent Labs: No results found for requested labs within last 8760 hours.   Lipid Panel    Component Value Date/Time   CHOL  07/26/2008 0630    175        ATP III CLASSIFICATION:  <200     mg/dL   Desirable  213-086  mg/dL   Borderline High  >=578    mg/dL   High   TRIG 50 46/96/2952 0630   HDL 67 07/26/2008 0630   CHOLHDL 2.6 07/26/2008 0630   VLDL 10 07/26/2008 0630   LDLCALC  07/26/2008 0630    98        Total Cholesterol/HDL:CHD Risk Coronary Heart Disease Risk Table                     Men   Women  1/2 Average Risk   3.4   3.3     Other studies Reviewed: Additional studies/ records that were reviewed today with results demonstrating: 2018 carotid doppler showed patent CEA sites.   ASSESSMENT AND PLAN:  1. S/p CEA: Likely due for repeat  Doppler.  She will contact [facility].  2. HTN: The current medical regimen is effective;  continue present plan and medications.  3. PAC: No sx.  Controlled.    Current medicines are reviewed at length with the patient today.  The patient concerns regarding her medicines were addressed.  The following changes have been made:  No change  Labs/ tests ordered today include:  No orders of the defined types were placed in this encounter.   Recommend 150 minutes/week of aerobic exercise Low fat, low carb, high fiber diet recommended  Disposition:   FU in 1 year   Signed, Lance Muss, MD  10/29/2018 9:55 AM    [facility] [address] [place], [place], [place]  [address] Phone: [phone]; Fax: [phone]

## 2018-10-29 ENCOUNTER — Encounter: Payer: Self-pay | Admitting: Interventional Cardiology

## 2018-10-29 ENCOUNTER — Ambulatory Visit: Payer: Medicare Other | Admitting: Interventional Cardiology

## 2018-10-29 VITALS — BP 140/64 | HR 65 | Ht 64.0 in | Wt 131.2 lb

## 2018-10-29 DIAGNOSIS — I10 Essential (primary) hypertension: Secondary | ICD-10-CM | POA: Diagnosis not present

## 2018-10-29 DIAGNOSIS — I491 Atrial premature depolarization: Secondary | ICD-10-CM | POA: Diagnosis not present

## 2018-10-29 DIAGNOSIS — Z9889 Other specified postprocedural states: Secondary | ICD-10-CM | POA: Diagnosis not present

## 2018-10-29 NOTE — Patient Instructions (Signed)

## 2018-11-28 ENCOUNTER — Other Ambulatory Visit: Payer: Self-pay | Admitting: Interventional Cardiology

## 2018-12-16 ENCOUNTER — Other Ambulatory Visit: Payer: Self-pay

## 2018-12-16 DIAGNOSIS — I6523 Occlusion and stenosis of bilateral carotid arteries: Secondary | ICD-10-CM

## 2018-12-16 DIAGNOSIS — Z9889 Other specified postprocedural states: Secondary | ICD-10-CM

## 2018-12-18 ENCOUNTER — Other Ambulatory Visit: Payer: Self-pay

## 2018-12-18 ENCOUNTER — Ambulatory Visit: Payer: Medicare Other | Admitting: Physician Assistant

## 2018-12-18 ENCOUNTER — Encounter: Payer: Self-pay | Admitting: Family

## 2018-12-18 ENCOUNTER — Ambulatory Visit (HOSPITAL_COMMUNITY)
Admission: RE | Admit: 2018-12-18 | Discharge: 2018-12-18 | Disposition: A | Discharge status: Home / Self Care | Payer: Medicare Other | Source: Ambulatory Visit | Attending: Family | Admitting: Family

## 2018-12-18 VITALS — BP 145/65 | HR 51 | Temp 97.3°F | Resp 14 | Ht 65.0 in | Wt 131.0 lb

## 2018-12-18 DIAGNOSIS — I6523 Occlusion and stenosis of bilateral carotid arteries: Secondary | ICD-10-CM | POA: Diagnosis not present

## 2018-12-18 DIAGNOSIS — Z9889 Other specified postprocedural states: Secondary | ICD-10-CM

## 2018-12-18 NOTE — Progress Notes (Addendum)
History of Present Illness:  Patient is a 83 y.o. year old female who presents for evaluation of carotid stenosis.  She is a  patient of Dr. Myra GianottiBrabham who returns today for followup. She is status post left carotid endarterectomy in November 2013 and right carotid endarterectomy in January of 2014. Both were done for asymptomatic high-grade stenosis.  She was seen in between the two CEA's by ENT; she had weak but functioning vocal cords.  She denies any change in her swallowing and states it is stable.  The patient denies symptoms of TIA, amaurosis, or stroke.  The patient is currently on aspirn antiplatelet therapy, statin and betablocker daily.  Past Medical History:  Diagnosis Date  . Allergy   . Anxiety   . Arthritis    hands  . CAD (coronary artery disease)   . Carotid artery occlusion   . Diverticulosis   . GERD (gastroesophageal reflux disease)   . Hiatal hernia   . Hyperlipidemia   . Hypertension   . IBS (irritable bowel syndrome)   . Renal artery stenosis [facility](HCC)     Past Surgical History:  Procedure Laterality Date  . ABDOMINAL HYSTERECTOMY    . BREAST SURGERY     implants removed  . ENDARTERECTOMY  10/09/2012   Procedure: ENDARTERECTOMY CAROTID;  Surgeon: Nada LibmanVance W Brabham, MD;  [facility];  Service: Vascular;  Laterality: Left;  . ENDARTERECTOMY  11/27/2012   Procedure: ENDARTERECTOMY CAROTID;  Surgeon: Nada LibmanVance W Brabham, MD;  [facility];  Service: Vascular;  Laterality: Right;  . EYE SURGERY     tear duct repair   bilateral  . PATCH ANGIOPLASTY  10/09/2012   Procedure: PATCH ANGIOPLASTY;  Surgeon: Nada LibmanVance W Brabham, MD;  [facility];  Service: Vascular;  Laterality: Left;  . PATCH ANGIOPLASTY  11/27/2012   Procedure: PATCH ANGIOPLASTY;  Surgeon: Nada LibmanVance W Brabham, MD;  [facility];  Service: Vascular;  Laterality: Right;  Using Vascu- Guard 1cm x 6cm patch.  . RECTAL PROLAPSE REPAIR     [facility]Greg Waters  [facility]  March 2015      Social History Social History   Tobacco Use  . Smoking status: Never Smoker  . Smokeless tobacco: Never Used  . Tobacco comment: couple oz wine daily  Substance Use Topics  . Alcohol use: No    Alcohol/week: 0.0 standard drinks  . Drug use: No    Family History Family History  Problem Relation Age of Onset  . Hyperlipidemia Mother   . Hypertension Mother   . Diabetes Father   . COPD Father   . Cancer Sister   . Heart attack Sister   . Stroke Son     Allergies  Allergies  Allergen Reactions  . Codeine Other (See Comments)    crazy  . Ace Inhibitors     cough     Current Outpatient Medications  Medication Sig Dispense Refill  . acetaminophen (TYLENOL) 500 MG tablet Take 500 mg by mouth as needed for mild pain or headache.     . ALPRAZolam (XANAX) 0.5 MG tablet Take 0.5 mg by mouth daily.     Marland Kitchen. amLODipine (NORVASC) 5 MG tablet Take 5 mg by mouth daily.    Marland Kitchen. aspirin 81 MG tablet Take 81 mg by mouth daily.    Marland Kitchen. atorvastatin (LIPITOR) 20 MG tablet TAKE ONE (1) TABLET BY MOUTH EVERY DAY 90 tablet 3  . Calcium Carb-Cholecalciferol (CALCIUM + D3) 600-200  MG-UNIT TABS Take 1 tablet by mouth daily.     . cycloSPORINE (RESTASIS) 0.05 % ophthalmic emulsion Place 1 drop into both eyes at bedtime. Emulsion 1 into affected eye    . diphenhydrAMINE (BENADRYL) 25 mg capsule Take 25 mg by mouth every 6 (six) hours as needed. For allergies    . escitalopram (LEXAPRO) 10 MG tablet Take 10 mg by mouth at bedtime.     . hydroxychloroquine (PLAQUENIL) 200 MG tablet Take 200 mg by mouth daily.    . metoprolol tartrate (LOPRESSOR) 25 MG tablet Take 25 mg by mouth 2 (two) times daily.    . Multiple Vitamins-Minerals (ICAPS PO) Take 1 tablet by mouth 2 (two) times daily.     Marland Kitchen. omeprazole (PRILOSEC) 20 MG capsule   5  . zolpidem (AMBIEN) 5 MG tablet     . zolpidem (AMBIEN) 10 MG tablet Take 5 mg by mouth at bedtime as needed. For sleep     No current facility-administered medications  for this visit.     ROS:   General:  No weight loss, Fever, chills  HEENT: No recent headaches, no nasal bleeding, no visual changes, no sore throat  Neurologic: No dizziness, blackouts, seizures. No recent symptoms of stroke or mini- stroke. No recent episodes of slurred speech, or temporary blindness.  [x]  balance issues unknown origin   Cardiac: No recent episodes of chest pain/pressure, no shortness of breath at rest.  No shortness of breath with exertion.  Denies history of atrial fibrillation or irregular heartbeat  Vascular: No history of rest pain in feet.  No history of claudication.  No history of non-healing ulcer, No history of DVT   Pulmonary: No home oxygen, no productive cough, no hemoptysis,  No asthma or wheezing  Musculoskeletal:  [ ]  Arthritis, [ ]  Low back pain,  [ ]  Joint pain  Hematologic:No history of hypercoagulable state.  No history of easy bleeding.  No history of anemia  Gastrointestinal: No hematochezia or melena,  No gastroesophageal reflux, no trouble swallowing  Urinary: [ ]  chronic Kidney disease, [ ]  on HD - [ ]  MWF or [ ]  TTHS, [ ]  Burning with urination, [ ]  Frequent urination, [ ]  Difficulty urinating;   Skin: No rashes  Psychological: No history of anxiety,  No history of depression   Physical Examination  Vitals:   12/18/18 0933 12/18/18 0938  BP: 136/66 (!) 145/65  Pulse: (!) 51 (!) 51  Resp: 14   Temp: (!) 97.3 F (36.3 C)   TempSrc: Oral   SpO2: 99%   Weight: 131 lb (59.4 kg)   Height: 5\' 5"  (1.651 m)     Body mass index is 21.8 kg/m.  General:  Alert and oriented, no acute distress HEENT: Normal, normocephalic  Neck: No bruit or JVD Pulmonary: Clear to auscultation bilaterally Cardiac: Regular Rate and Rhythm without murmur Gastrointestinal: Soft, non-tender, non-distended, no mass, no scars Skin: No rash Extremity Pulses:  2+ radial, brachial, femoral, PT pulses bilaterally Musculoskeletal: No deformity or  edema  Neurologic: Upper and lower extremity motor 5/5 and symmetric  DATA:    Right Carotid Findings: +----------+--------+--------+--------+------------+--------+           PSV cm/sEDV cm/sStenosisDescribe    Comments +----------+--------+--------+--------+------------+--------+ CCA Prox  68      15      <50%    homogeneous          +----------+--------+--------+--------+------------+--------+ CCA Mid   65      20                                   +----------+--------+--------+--------+------------+--------+  CCA Distal59      18      <50%    heterogenous         +----------+--------+--------+--------+------------+--------+ ICA Prox  106     32      1-39%   homogeneous          +----------+--------+--------+--------+------------+--------+ ICA Mid   104     32                                   +----------+--------+--------+--------+------------+--------+ ICA Distal151     38                          tortuous +----------+--------+--------+--------+------------+--------+ ECA       517     99      >50%    heterogenous         +----------+--------+--------+--------+------------+--------+  +----------+--------+-------+----------------+-------------------+           PSV cm/sEDV cmsDescribe        Arm Pressure (mmHG) +----------+--------+-------+----------------+-------------------+ [id number]            Multiphasic, WNL                    +----------+--------+-------+----------------+-------------------+  +---------+--------+--+--------+--+---------+ VertebralPSV cm/s67EDV cm/s18Antegrade +---------+--------+--+--------+--+---------+    Left Carotid Findings: +----------+--------+--------+--------+------------+--------+           PSV cm/sEDV cm/sStenosisDescribe    Comments +----------+--------+--------+--------+------------+--------+ CCA Prox  86      23                                    +----------+--------+--------+--------+------------+--------+ CCA Mid   92      25                                   +----------+--------+--------+--------+------------+--------+ CCA Distal87      22      <50%    heterogenous         +----------+--------+--------+--------+------------+--------+ ICA Prox  97      32      1-39%                        +----------+--------+--------+--------+------------+--------+ ICA Mid   107     36                                   +----------+--------+--------+--------+------------+--------+ ICA Distal102     32                                   +----------+--------+--------+--------+------------+--------+ ECA       112     13      <50%    heterogenous         +----------+--------+--------+--------+------------+--------+  +----------+--------+--------+----------------+-------------------+ SubclavianPSV cm/sEDV cm/sDescribe        Arm Pressure (mmHG) +----------+--------+--------+----------------+-------------------+           122             Multiphasic, WNL                    +----------+--------+--------+----------------+-------------------+  +---------+--------+--+--------+--+---------+ VertebralPSV cm/s97EDV  cm/s19Antegrade +---------+--------+--+--------+--+---------+    Summary: Right Carotid: Velocities in the right ICA are consistent with a 1-39% stenosis.                Non-hemodynamically significant plaque <50% noted in the CCA. The                ECA appears >50% stenosed. Patent right carotid endarterectomy                site with evidence of mild hyperplasia at the proximal patch.  Left Carotid: Velocities in the left ICA are consistent with a 1-39% stenosis.               Non-hemodynamically significant plaque noted in the CCA. The ECA               appears <50% stenosed. Patent left carotid endarterectomy site               with evidence of mild hyperplasia at the  proximal patch.  Vertebrals:  Bilateral vertebral arteries demonstrate antegrade flow. Subclavians: Normal flow hemodynamics were seen in bilateral subclavian              arteries.  ASSESSMENT:  Asymptomatic Carotid stenosis S/P B CEA R 2014 and L 2013    PLAN: She has not had symptoms of stroke or TIA.  She is very active and cares for her 38 year old granddaughter some during the week.  There are no significant changes in her carotid duplex studies today.  She will f/u in 1 year for repeat surveillance B carotid duplex studies.   Mosetta Pigeon PA-C [facility] Office: [phone]  MD in clinic [place]

## 2019-02-27 MED FILL — ZOLPIDEM TARTRATE 5 MG TAB: 5 | 30 days supply | Qty: 30 | Fill #0

## 2019-02-27 MED FILL — ATORVASTATIN 20 MG TABLET: 20 | 90 days supply | Qty: 90 | Fill #0

## 2019-02-27 MED FILL — ALPRAZolam 0.5 MG TABS: 0.5 | 60 days supply | Qty: 60 | Fill #0

## 2019-03-04 MED FILL — MELOXICAM 7.5 MG TABLET: 7.5 | 30 days supply | Qty: 30 | Fill #0

## 2019-04-01 MED FILL — ZOLPIDEM TARTRATE 5 MG TAB: 5 | 30 days supply | Qty: 30 | Fill #0

## 2019-04-03 MED FILL — ALPRAZolam 0.5 MG TABS: 0.5 | 30 days supply | Qty: 60 | Fill #0

## 2019-04-29 MED FILL — ZOLPIDEM TARTRATE 5 MG TAB: 5 | 30 days supply | Qty: 30 | Fill #1

## 2019-04-29 MED FILL — AMLODIPINE BESYLATE 5 MG TA: 5 | 90 days supply | Qty: 90 | Fill #0

## 2019-04-29 MED FILL — METOPROLOL TARTRATE 25 MG T: 25 | 90 days supply | Qty: 180 | Fill #0

## 2019-04-29 MED FILL — HYDROXYCHLOROQUINE SULFATE: 200 | 90 days supply | Qty: 90 | Fill #0

## 2019-04-29 MED FILL — ESCITALOPRAM 10 MG TABLET: 10 | 90 days supply | Qty: 90 | Fill #0

## 2019-05-01 MED FILL — ALPRAZolam 0.5 MG TABS: 0.5 | 30 days supply | Qty: 60 | Fill #1

## 2019-06-01 MED FILL — ZOLPIDEM TARTRATE 5 MG TAB: 5 | 30 days supply | Qty: 30 | Fill #0

## 2019-06-01 MED FILL — ALPRAZolam 0.5 MG TABS: 0.5 | 30 days supply | Qty: 60 | Fill #0

## 2019-11-29 NOTE — Progress Notes (Signed)
Cardiology Office Note   Date:  12/02/2019   ID:  Shirley Shea, Shirley Shea December 19, 1935, MRN [id number]  PCP:  Ralene Ok, MD    No chief complaint on file.  Carotid disease  Wt Readings from Last 3 Encounters:  12/02/19 132 lb 1.9 oz (59.9 kg)  12/18/18 131 lb (59.4 kg)  10/29/18 131 lb 3.2 oz (59.5 kg)       History of Present Illness: Shirley Shea is a 84 y.o. female  who has had HTN and renal artery stenosis.  In 2013, she had bilateral carotid stenosis diagnosed and had bilateral CEA. She did very well.  She is getting her routine carotid ultrasounds as well.  In 2018, stopped Niacin. LDL increased from May 2018 to 12/18 from 77 to 103. She was eating frozen dinners and more pasta for convenience.   Diet improved in 2019.  Snacks in the afternoon.  No eating after 6:30 PM.  One main meal is lunch/brunch.  Sticking to more natural foods, especially in the summer.   Since the last visit, she has felt well.  SHe has stayed out of crowds given COVID.  Stamina has decreased.   She does walk regularly for exercise.  If the weather is bad, she walks inside.  15-20 minutes at a time.   Denies : Chest pain. Dizziness. Leg edema. Nitroglycerin use. Orthopnea. Palpitations. Paroxysmal nocturnal dyspnea. Shortness of breath. Syncope.     Past Medical History:  Diagnosis Date  . Allergy   . Anxiety   . Arthritis    hands  . CAD (coronary artery disease)   . Carotid artery occlusion   . Diverticulosis   . GERD (gastroesophageal reflux disease)   . Hiatal hernia   . Hyperlipidemia   . Hypertension   . IBS (irritable bowel syndrome)   . Renal artery stenosis [name])     Past Surgical History:  Procedure Laterality Date  . ABDOMINAL HYSTERECTOMY    . BREAST SURGERY     implants removed  . ENDARTERECTOMY  10/09/2012   Procedure: ENDARTERECTOMY CAROTID;  Surgeon: Nada Libman, MD;  Location[facility] OR;  Service: Vascular;  Laterality: Left;  . ENDARTERECTOMY   11/27/2012   Procedure: ENDARTERECTOMY CAROTID;  Surgeon: Nada Libman, MD;  Location: [facility] OR;  Service: Vascular;  Laterality: Right;  . EYE SURGERY     tear duct repair   bilateral  . PATCH ANGIOPLASTY  10/09/2012   Procedure: PATCH ANGIOPLASTY;  Surgeon: Nada Libman, MD;  Location: [facility] OR;  Service: Vascular;  Laterality: Left;  . PATCH ANGIOPLASTY  11/27/2012   Procedure: PATCH ANGIOPLASTY;  Surgeon: Nada Libman, MD;  Location: [facility] OR;  Service: Vascular;  Laterality: Right;  Using Vascu- Guard 1cm x 6cm patch.  . RECTAL PROLAPSE REPAIR     [facility]. Dr. Mirian Mo  . [facility]  March 2015     Current Outpatient Medications  Medication Sig Dispense Refill  . acetaminophen (TYLENOL) 500 MG tablet Take 500 mg by mouth as needed for mild pain or headache.     . ALPRAZolam (XANAX) 0.5 MG tablet Take 0.5 mg by mouth daily.     Marland Kitchen amLODipine (NORVASC) 5 MG tablet Take 5 mg by mouth daily.    Marland Kitchen aspirin 81 MG tablet Take 81 mg by mouth daily.    Marland Kitchen atorvastatin (LIPITOR) 20 MG tablet TAKE ONE (1) TABLET BY MOUTH EVERY DAY 90 tablet 3  . Calcium Carb-Cholecalciferol (CALCIUM +  D3) 600-200 MG-UNIT TABS Take 1 tablet by mouth daily.     . cycloSPORINE (RESTASIS) 0.05 % ophthalmic emulsion Place 1 drop into both eyes at bedtime. Emulsion 1 into affected eye    . diphenhydrAMINE (BENADRYL) 25 mg capsule Take 25 mg by mouth every 6 (six) hours as needed. For allergies    . escitalopram (LEXAPRO) 10 MG tablet Take 10 mg by mouth at bedtime.     . hydroxychloroquine (PLAQUENIL) 200 MG tablet Take 200 mg by mouth daily.    . metoprolol tartrate (LOPRESSOR) 25 MG tablet Take 25 mg by mouth 2 (two) times daily.    . Multiple Vitamins-Minerals (ICAPS PO) Take 1 tablet by mouth 2 (two) times daily.     Marland Kitchen omeprazole (PRILOSEC) 20 MG capsule   5  . zolpidem (AMBIEN) 5 MG tablet      No current facility-administered medications for this visit.    Allergies:   Codeine and Ace inhibitors     Social History:  The patient  reports that she has never smoked. She has never used smokeless tobacco. She reports that she does not drink alcohol or use drugs.   Family History:  The patient's family history includes COPD in her father; Cancer in her sister; Diabetes in her father; Heart attack in her sister; Hyperlipidemia in her mother; Hypertension in her mother; Stroke in her son.    ROS:  Please see the history of present illness.   Otherwise, review of systems are positive for fatigue.   All other systems are reviewed and negative.    PHYSICAL EXAM: VS:  BP 132/60   Pulse (!) 49   Ht 5\' 5"  (1.651 m)   Wt 132 lb 1.9 oz (59.9 kg)   SpO2 98%   BMI 21.99 kg/m  , BMI Body mass index is 21.99 kg/m. GEN: Well nourished, well developed, in no acute distress  HEENT: normal  Neck: no JVD, carotid bruits, or masses Cardiac: bradycardic; no murmurs, rubs, or gallops,no edema  Respiratory:  clear to auscultation bilaterally, normal work of breathing GI: soft, nontender, nondistended, + BS MS: no deformity or atrophy  Skin: warm and dry, no rash Neuro:  Strength and sensation are intact Psych: euthymic mood, full affect   EKG:   The ekg ordered today demonstrates Sinus bradycardia, nonspecific ST changes   Recent Labs: No results found for requested labs within last 8760 hours.   Lipid Panel    Component Value Date/Time   CHOL  07/26/2008 0630    175        ATP III CLASSIFICATION:  <200     mg/dL   Desirable  200-239  mg/dL   Borderline High  >=240    mg/dL   High   TRIG 50 07/26/2008 0630   HDL 67 07/26/2008 0630   CHOLHDL 2.6 07/26/2008 0630   VLDL 10 07/26/2008 0630   LDLCALC  07/26/2008 0630    98        Total Cholesterol/HDL:CHD Risk Coronary Heart Disease Risk Table                     Men   Women  1/2 Average Risk   3.4   3.3     Other studies Reviewed: Additional studies/ records that were reviewed today with results demonstrating: labs reviewed; CBC,  CMet and lipids well controlled in 10/2019.   ASSESSMENT AND PLAN:  1. S/p CEA/PAD- carotid disease: followed by Doppler at [facility]. 2.  HTN: The current medical regimen is effective;  continue present plan and medications.  Stop metoprolol given bradycardia.  Increase amlodipine if needed to 10 mg daily.  Avoid rate slowing drugs.  This may help with fatigue.  She will send [place] some home BP readings.  3. PACs: No sx.  Will see if she has issues off of metoprolol.     Current medicines are reviewed at length with the patient today.  The patient concerns regarding her medicines were addressed.  The following changes have been made:  Stop metoprolol  Labs/ tests ordered today include:  No orders of the defined types were placed in this encounter.   Recommend 150 minutes/week of aerobic exercise Low fat, low carb, high fiber diet recommended  Disposition:   FU in 1 year   Signed, Lance Muss, MD  12/02/2019 9:46 AM    [facility] [address] [place], [place], [place]  [address] Phone: [phone]; Fax: [phone]

## 2019-12-02 ENCOUNTER — Ambulatory Visit: Payer: Medicare Other | Admitting: Interventional Cardiology

## 2019-12-02 ENCOUNTER — Other Ambulatory Visit: Payer: Self-pay

## 2019-12-02 ENCOUNTER — Encounter: Payer: Self-pay | Admitting: Interventional Cardiology

## 2019-12-02 VITALS — BP 132/60 | HR 49 | Ht 65.0 in | Wt 132.1 lb

## 2019-12-02 DIAGNOSIS — I491 Atrial premature depolarization: Secondary | ICD-10-CM

## 2019-12-02 DIAGNOSIS — I10 Essential (primary) hypertension: Secondary | ICD-10-CM | POA: Diagnosis not present

## 2019-12-02 DIAGNOSIS — Z9889 Other specified postprocedural states: Secondary | ICD-10-CM | POA: Diagnosis not present

## 2019-12-02 DIAGNOSIS — I6523 Occlusion and stenosis of bilateral carotid arteries: Secondary | ICD-10-CM | POA: Diagnosis not present

## 2019-12-02 NOTE — Patient Instructions (Signed)
Medication Instructions:  Your physician has recommended you make the following change in your medication:   STOP: metoprolol  *If you need a refill on your cardiac medications before your next appointment, please call your pharmacy*  Lab Work: None ordered  If you have labs (blood work) drawn today and your tests are completely normal, you will receive your results only by: Marland Kitchen MyChart Message (if you have MyChart) OR . A paper copy in the mail If you have any lab test that is abnormal or we need to change your treatment, we will call you to review the results.  Testing/Procedures: None ordered  Follow-Up: At [facility], you and your health needs are our priority.  As part of our continuing mission to provide you with exceptional heart care, we have created designated Provider Care Teams.  These Care Teams include your primary Cardiologist (physician) and Advanced Practice Providers (APPs -  Physician Assistants and Nurse Practitioners) who all work together to provide you with the care you need, when you need it.  Your next appointment:   12 month(s)  The format for your next appointment:   In Person  Provider:   You may see Everette Rank, MD or one of the following Advanced Practice Providers on your designated Care Team:    Ronie Spies, PA-C  Jacolyn Reedy, PA-C   Other Instructions Your physician has requested that you regularly monitor and record your blood pressure readings at home. Please use the same machine at the same time of day to check your readings and record them and send them through MyChart.  We are recommending the COVID-19 vaccine to all of our patients. Cardiac medications (including blood thinners) should not deter anyone from being vaccinated and there is no need to hold any of those medications prior to vaccine administration.   Currently, there is a hotline to call (active 11/27/19) to schedule vaccination appointments as no walk-ins will be accepted.     Vaccines through the health department can be arranged by calling [phone]    Vaccines through [facility] can be arranged by calling [phone] or visiting [email or web address]   If you have further questions or concerns about the vaccine process, please visit [email or web address], [email or web address], or contact your primary care physician.

## 2019-12-09 ENCOUNTER — Other Ambulatory Visit: Payer: Self-pay | Admitting: Physician Assistant

## 2019-12-09 DIAGNOSIS — R1031 Right lower quadrant pain: Secondary | ICD-10-CM

## 2019-12-09 DIAGNOSIS — R1032 Left lower quadrant pain: Secondary | ICD-10-CM

## 2019-12-16 ENCOUNTER — Ambulatory Visit
Admission: RE | Admit: 2019-12-16 | Discharge: 2019-12-16 | Disposition: A | Discharge status: Home / Self Care | Payer: Medicare Other | Source: Ambulatory Visit | Attending: Physician Assistant | Admitting: Physician Assistant

## 2019-12-16 DIAGNOSIS — R1031 Right lower quadrant pain: Secondary | ICD-10-CM

## 2019-12-16 DIAGNOSIS — R1032 Left lower quadrant pain: Secondary | ICD-10-CM

## 2019-12-16 MED ORDER — IOPAMIDOL (ISOVUE-300) INJECTION 61%
100.0000 mL | Freq: Once | INTRAVENOUS | Status: AC | PRN
  Administered 2019-12-16: 100 mL via INTRAVENOUS

## 2019-12-17 ENCOUNTER — Telehealth: Payer: Self-pay | Admitting: Interventional Cardiology

## 2019-12-17 NOTE — Telephone Encounter (Signed)
Patient was advised to take BP reading for a few days and called them in:  136/78 159/86 HR 75 146/84 HR 90 120/77 HR 90 127/48 HR 87 115/77 HR 70

## 2019-12-18 NOTE — Telephone Encounter (Signed)
Last few readings on the list are well controlled so I would continue current meds without changes.   JV

## 2019-12-21 NOTE — Telephone Encounter (Signed)
Left detailed message per DPR letting patient know that her last few BP readings are well controlled therefore we will not change anything at this time and will need to continue current medications. Instructed for patient to call back with any questions or concerns.

## 2019-12-25 ENCOUNTER — Other Ambulatory Visit: Payer: Self-pay | Admitting: Interventional Cardiology

## 2020-01-04 NOTE — Progress Notes (Signed)
Office Note     CC:  follow up Requesting Provider:  Ralene Ok, MD  HPI: Shirley Shea is a 84 y.o. (Aug 05, 1936) female who presents for routine surveillance of carotid stenosis. She is status post left carotid endarterectomy in November 2013 and right carotid Endarterectomy in January of 2014. Both carotid surgeries were done for asymptomatic high grade stenosis.  She continues to have weak but functional vocal cords with hoarseness that has not change from prior visits. She otherwise denies any trouble speaking or trouble swallowing. She denies any upper or lower extremity weakness or numbness, slurred speech, amaurosis or visual changes. She does state she has fatigue and transient symptoms that occur secondary to her Lupus but nothing that limits her activities of daily living  She continues to taker her Aspirin and statin medication  The pt is on a statin for cholesterol management.  The pt is on a daily aspirin.   Other AC:  none The pt is on amlodipine for hypertension.   The pt is not diabetic.   Tobacco hx:  never  Past Medical History:  Diagnosis Date  . Allergy   . Anxiety   . Arthritis    hands  . CAD (coronary artery disease)   . Carotid artery occlusion   . Diverticulosis   . GERD (gastroesophageal reflux disease)   . Hiatal hernia   . Hyperlipidemia   . Hypertension   . IBS (irritable bowel syndrome)   . Renal artery stenosis [facility])     Past Surgical History:  Procedure Laterality Date  . ABDOMINAL HYSTERECTOMY    . BREAST SURGERY     implants removed  . ENDARTERECTOMY  10/09/2012   Procedure: ENDARTERECTOMY CAROTID;  Surgeon: Nada Libman, MD;  Location: [facility] OR;  Service: Vascular;  Laterality: Left;  . ENDARTERECTOMY  11/27/2012   Procedure: ENDARTERECTOMY CAROTID;  Surgeon: Nada Libman, MD;  Location: [facility] OR;  Service: Vascular;  Laterality: Right;  . EYE SURGERY     tear duct repair   bilateral  . PATCH ANGIOPLASTY  10/09/2012   Procedure:  PATCH ANGIOPLASTY;  Surgeon: Nada Libman, MD;  Location: [facility] OR;  Service: Vascular;  Laterality: Left;  . PATCH ANGIOPLASTY  11/27/2012   Procedure: PATCH ANGIOPLASTY;  Surgeon: Nada Libman, MD;  Location: [facility] OR;  Service: Vascular;  Laterality: Right;  Using Vascu- Guard 1cm x 6cm patch.  . RECTAL PROLAPSE REPAIR     [facility]. Dr. Mirian Mo  . [facility]  March 2015    Social History   Socioeconomic History  . Marital status: Divorced    Spouse name: Not on file  . Number of children: Not on file  . Years of education: Not on file  . Highest education level: Not on file  Occupational History  . Not on file  Tobacco Use  . Smoking status: Never Smoker  . Smokeless tobacco: Never Used  . Tobacco comment: couple oz wine daily  Substance and Sexual Activity  . Alcohol use: No    Alcohol/week: 0.0 standard drinks  . Drug use: No  . Sexual activity: Not Currently  Other Topics Concern  . Not on file  Social History Narrative  . Not on file   Social Determinants of Health   Financial Resource Strain:   . Difficulty of Paying Living Expenses: Not on file  Food Insecurity:   . Worried About Programme researcher, broadcasting/film/video in the Last Year: Not on file  . Ran  Out of Food in the Last Year: Not on file  Transportation Needs:   . Lack of Transportation (Medical): Not on file  . Lack of Transportation (Non-Medical): Not on file  Physical Activity:   . Days of Exercise per Week: Not on file  . Minutes of Exercise per Session: Not on file  Stress:   . Feeling of Stress : Not on file  Social Connections:   . Frequency of Communication with Friends and Family: Not on file  . Frequency of Social Gatherings with Friends and Family: Not on file  . Attends Religious Services: Not on file  . Active Member of Clubs or Organizations: Not on file  . Attends Archivist Meetings: Not on file  . Marital Status: Not on file  Intimate Partner Violence:   . Fear of Current or  Ex-Partner: Not on file  . Emotionally Abused: Not on file  . Physically Abused: Not on file  . Sexually Abused: Not on file    Family History  Problem Relation Age of Onset  . Hyperlipidemia Mother   . Hypertension Mother   . Diabetes Father   . COPD Father   . Cancer Sister   . Heart attack Sister   . Stroke Son     Current Outpatient Medications  Medication Sig Dispense Refill  . acetaminophen (TYLENOL) 500 MG tablet Take 500 mg by mouth as needed for mild pain or headache.     . ALPRAZolam (XANAX) 0.5 MG tablet Take 0.5 mg by mouth daily.     Marland Kitchen amLODipine (NORVASC) 5 MG tablet Take 5 mg by mouth daily.    Marland Kitchen aspirin 81 MG tablet Take 81 mg by mouth daily.    Marland Kitchen atorvastatin (LIPITOR) 20 MG tablet TAKE ONE (1) TABLET BY MOUTH EVERY DAY 90 tablet 3  . Calcium Carb-Cholecalciferol (CALCIUM + D3) 600-200 MG-UNIT TABS Take 1 tablet by mouth daily.     . cycloSPORINE (RESTASIS) 0.05 % ophthalmic emulsion Place 1 drop into both eyes at bedtime. Emulsion 1 into affected eye    . diphenhydrAMINE (BENADRYL) 25 mg capsule Take 25 mg by mouth every 6 (six) hours as needed. For allergies    . escitalopram (LEXAPRO) 10 MG tablet Take 10 mg by mouth at bedtime.     . hydroxychloroquine (PLAQUENIL) 200 MG tablet Take 200 mg by mouth daily.    . Multiple Vitamins-Minerals (ICAPS PO) Take 1 tablet by mouth 2 (two) times daily.     Marland Kitchen omeprazole (PRILOSEC) 20 MG capsule   5  . zolpidem (AMBIEN) 5 MG tablet      No current facility-administered medications for this visit.    Allergies  Allergen Reactions  . Codeine Other (See Comments)    crazy  . Ace Inhibitors     cough     REVIEW OF SYSTEMS:  Review of Systems  Constitutional: Positive for malaise/fatigue. Negative for chills, fever and weight loss.  HENT: Negative for congestion, sore throat and tinnitus.   Eyes: Negative for blurred vision and double vision.  Respiratory: Negative for cough and shortness of breath.     Cardiovascular: Negative for chest pain, palpitations, claudication and leg swelling.  Gastrointestinal: Negative for abdominal pain, constipation, diarrhea, nausea and vomiting.  Genitourinary: Negative for dysuria and hematuria.  Musculoskeletal: Negative for joint pain.    PHYSICAL EXAMINATION:  Vitals:   01/06/20 1132 01/06/20 1134  BP: 122/72 (!) 165/72  Pulse: 72 72  Temp: (!) 96.5 F (35.8 C)  SpO2: 100%   Weight: 131 lb 11.2 oz (59.7 kg)   Height: 5\' 5"  (1.651 m)     General: well nourished, very pleasant and well appearing Gait: Normal HENT: WNL, normocephalic Pulmonary: normal non-labored breathing without wheezing Cardiac: regular HR, without  Murmurs without carotid bruit Abdomen: soft, NT, no masses Skin: without rashes Vascular Exam/Pulses:  Right Left  Radial 2+ (normal) 2+ (normal)  Ulnar 2+ (normal) 2+ (normal)  Femoral 2+ (normal) 2+ (normal)  Popliteal 2+ (normal) 2+ (normal)  DP 2+ (normal) 2+ (normal)  PT 2+ (normal) 2+ (normal)   Extremities: without ischemic changes, without Gangrene , without cellulitis; without open wounds;  Musculoskeletal: no muscle wasting or atrophy  Neurologic: A&O X 3;  No focal weakness or paresthesias are detected Psychiatric:  The pt has Normal affect.   Non-Invasive Vascular Imaging:   01/06/20 Bilateral Carotid Arterial Duplex Right carotid: Velocities in the right ICA are consistent with 1-39% stenosis. ECA with >50% stenosis. Left Carotid: velocities in the left ICA are consistent with 1-39% stenosis. Bilateral vertebral arteries demonstrate antegrade flow. Subclavian arteries have normal flow    ASSESSMENT/PLAN:: 84 y.o. female here for follow up for She is status post left carotid endarterectomy in November 2013 and right carotid Endarterectomy in January of 2014. Both carotid surgeries were done for asymptomatic high grade stenosis. She has not had any symptoms of TIA or stroke. There is no significant changes  in her carotid duplex studies. She will continue her current Aspirin and statin. I reviewed signs and symptoms of stroke with the patient and she understands to go to the Emergency room should any of these occur. If she has any other concerns she will follow up in the office sooner.  Will plan to see her for follow up in 1 year with repeat Carotid ultrasound   12-25-2002, PA-C [facility] [phone]  Clinic MD: Dr. [phone]

## 2020-01-05 ENCOUNTER — Telehealth (HOSPITAL_COMMUNITY): Payer: Self-pay

## 2020-01-05 ENCOUNTER — Other Ambulatory Visit: Payer: Self-pay

## 2020-01-05 DIAGNOSIS — I6523 Occlusion and stenosis of bilateral carotid arteries: Secondary | ICD-10-CM

## 2020-01-05 NOTE — Telephone Encounter (Signed)

## 2020-01-06 ENCOUNTER — Other Ambulatory Visit: Payer: Self-pay

## 2020-01-06 ENCOUNTER — Ambulatory Visit: Payer: Medicare Other | Admitting: Physician Assistant

## 2020-01-06 ENCOUNTER — Ambulatory Visit (HOSPITAL_COMMUNITY)
Admission: RE | Admit: 2020-01-06 | Discharge: 2020-01-06 | Disposition: A | Discharge status: Home / Self Care | Payer: Medicare Other | Source: Ambulatory Visit | Attending: Vascular Surgery | Admitting: Vascular Surgery

## 2020-01-06 VITALS — BP 165/72 | HR 72 | Temp 96.5°F | Ht 65.0 in | Wt 131.7 lb

## 2020-01-06 DIAGNOSIS — Z9889 Other specified postprocedural states: Secondary | ICD-10-CM | POA: Diagnosis not present

## 2020-01-06 DIAGNOSIS — I6523 Occlusion and stenosis of bilateral carotid arteries: Secondary | ICD-10-CM | POA: Diagnosis present

## 2020-01-08 ENCOUNTER — Other Ambulatory Visit: Payer: Self-pay | Admitting: *Deleted

## 2020-01-08 DIAGNOSIS — I6523 Occlusion and stenosis of bilateral carotid arteries: Secondary | ICD-10-CM

## 2020-01-09 ENCOUNTER — Ambulatory Visit: Payer: Medicare Other | Attending: Internal Medicine

## 2020-01-09 DIAGNOSIS — Z23 Encounter for immunization: Secondary | ICD-10-CM | POA: Insufficient documentation

## 2020-01-09 NOTE — Progress Notes (Signed)
   Covid-19 Vaccination Clinic  Name:  GWENDOLIN BRIEL    MRN: [id number] DOB: [birth year]  01/09/2020  Ms. Sabia was observed post Covid-19 immunization for 15 minutes without incidence. She was provided with Vaccine Information Sheet and instruction to access the V-Safe system.   Ms. Bevans was instructed to call 911 with any severe reactions post vaccine: Marland Kitchen Difficulty breathing  . Swelling of your face and throat  . A fast heartbeat  . A bad rash all over your body  . Dizziness and weakness    Immunizations Administered    Name Date Dose VIS Date Route   Pfizer COVID-19 Vaccine 01/09/2020  8:33 AM 0.3 mL 10/30/2019 Intramuscular   Manufacturer: [employer], [employer]   Lot: [id number]   NDC: [address]-9

## 2020-02-02 ENCOUNTER — Ambulatory Visit: Payer: Medicare Other | Attending: Internal Medicine

## 2020-02-02 DIAGNOSIS — Z23 Encounter for immunization: Secondary | ICD-10-CM

## 2020-02-02 NOTE — Progress Notes (Signed)
   Covid-19 Vaccination Clinic  Name:  Shirley Shea    MRN: [id number] DOB: Aug 07, 1936  02/02/2020  [name] was observed post Covid-19 immunization for 15 minutes without incident. She was provided with Vaccine Information Sheet and instruction to access the V-Safe system.   [name] was instructed to call 911 with any severe reactions post vaccine: Marland Kitchen Difficulty breathing  . Swelling of face and throat  . A fast heartbeat  . A bad rash all over body  . Dizziness and weakness   Immunizations Administered    Name Date Dose VIS Date Route   Pfizer COVID-19 Vaccine 02/02/2020  9:21 AM 0.3 mL 10/30/2019 Intramuscular   Manufacturer: [employer], [employer]   Lot: [id number]   NDC: [address]-6      Covid-19 Vaccination Clinic  Name:  Shirley Shea    MRN: [id number] DOB: 04-08-1936  02/02/2020  [name] was observed post Covid-19 immunization for 15 minutes without incident. She was provided with Vaccine Information Sheet and instruction to access the V-Safe system.   [name] was instructed to call 911 with any severe reactions post vaccine: Marland Kitchen Difficulty breathing  . Swelling of face and throat  . A fast heartbeat  . A bad rash all over body  . Dizziness and weakness

## 2020-02-24 ENCOUNTER — Telehealth: Payer: Self-pay

## 2020-02-24 NOTE — Telephone Encounter (Signed)
Left detailed message on patient's VM per DPR letting her know that Dr. Eldridge Dace reviewed her BPs that she mailed in and they look good and to continue current medications. Instructed for patient to call back if there are any questions.

## 2020-07-11 ENCOUNTER — Other Ambulatory Visit (HOSPITAL_COMMUNITY): Payer: Self-pay | Admitting: Internal Medicine

## 2020-10-10 ENCOUNTER — Other Ambulatory Visit (HOSPITAL_COMMUNITY): Payer: Self-pay | Admitting: Internal Medicine

## 2020-10-25 ENCOUNTER — Encounter: Payer: Self-pay | Admitting: Internal Medicine

## 2020-11-02 ENCOUNTER — Other Ambulatory Visit (HOSPITAL_COMMUNITY): Payer: Self-pay | Admitting: Internal Medicine

## 2020-12-07 ENCOUNTER — Ambulatory Visit: Payer: Medicare Other | Admitting: Interventional Cardiology

## 2020-12-08 ENCOUNTER — Ambulatory Visit: Payer: Medicare Other | Admitting: Interventional Cardiology

## 2020-12-26 ENCOUNTER — Other Ambulatory Visit: Payer: Self-pay | Admitting: Interventional Cardiology

## 2020-12-26 ENCOUNTER — Other Ambulatory Visit (HOSPITAL_COMMUNITY): Payer: Self-pay | Admitting: Interventional Cardiology

## 2021-01-05 ENCOUNTER — Ambulatory Visit: Payer: Medicare Other | Admitting: Physician Assistant

## 2021-01-05 ENCOUNTER — Ambulatory Visit (HOSPITAL_COMMUNITY)
Admission: RE | Admit: 2021-01-05 | Discharge: 2021-01-05 | Disposition: A | Discharge status: Home / Self Care | Payer: Medicare Other | Source: Ambulatory Visit | Attending: Surgery | Admitting: Surgery

## 2021-01-05 ENCOUNTER — Other Ambulatory Visit: Payer: Self-pay

## 2021-01-05 VITALS — BP 145/72 | HR 74 | Temp 98.0°F | Resp 20 | Ht 65.0 in | Wt 131.8 lb

## 2021-01-05 DIAGNOSIS — R1032 Left lower quadrant pain: Secondary | ICD-10-CM | POA: Insufficient documentation

## 2021-01-05 DIAGNOSIS — R159 Full incontinence of feces: Secondary | ICD-10-CM | POA: Insufficient documentation

## 2021-01-05 DIAGNOSIS — I6523 Occlusion and stenosis of bilateral carotid arteries: Secondary | ICD-10-CM

## 2021-01-05 DIAGNOSIS — R1031 Right lower quadrant pain: Secondary | ICD-10-CM | POA: Insufficient documentation

## 2021-01-05 DIAGNOSIS — R1312 Dysphagia, oropharyngeal phase: Secondary | ICD-10-CM | POA: Insufficient documentation

## 2021-01-05 DIAGNOSIS — R197 Diarrhea, unspecified: Secondary | ICD-10-CM | POA: Insufficient documentation

## 2021-01-05 DIAGNOSIS — K5730 Diverticulosis of large intestine without perforation or abscess without bleeding: Secondary | ICD-10-CM | POA: Insufficient documentation

## 2021-01-05 NOTE — Progress Notes (Signed)
Office Note     CC:  follow up Requesting Provider:  Ralene Ok, MD  HPI: Shirley Shea is a 85 y.o. ([birth year]) female who presents for routine follow up of carotid artery disease. She has history of left carotid endarterectomy in November 2013 and right carotid Endarterectomy in January of 2014. Both carotid surgeries were done for asymptomatic high grade stenosis.  She presents today for her annual follow up and carotid duplex. She explains that she continues to have weak but functional vocal cords with hoarseness, but this is unchanged from prior visits. She mostly notices this if she is speaking for a prolonged period of time. Some days she has no issues and others she notices it more. She otherwise denies any trouble speaking or trouble swallowing. She denies any upper or lower extremity weakness or numbness, slurred speech, amaurosis or visual changes. She did have cataract surgery to bilateral eyes in the summer of 2021 and her vision is now much better. She still suffers from macular degeneration. She does state she has fatigue and transient symptoms that occur secondary to her Lupus but nothing that limits her activities of daily living.  She continues to taker her Aspirin and statin medication  Her medical history is otherwise unchanged from her prior visits.  The pt is on a statin for cholesterol management.  The pt is on a daily aspirin.   Other AC:  none The pt is on amlodipine for hypertension.   The pt is not diabetic.   Tobacco hx:  never  Past Medical History:  Diagnosis Date  . Allergy   . Anxiety   . Arthritis    hands  . CAD (coronary artery disease)   . Carotid artery occlusion   . Diverticulosis   . GERD (gastroesophageal reflux disease)   . Hiatal hernia   . Hyperlipidemia   . Hypertension   . IBS (irritable bowel syndrome)   . Renal artery stenosis [facility])     Past Surgical History:  Procedure Laterality Date  . ABDOMINAL HYSTERECTOMY    .  BREAST SURGERY     implants removed  . ENDARTERECTOMY  10/09/2012   Procedure: ENDARTERECTOMY CAROTID;  Surgeon: Nada Libman, MD;  Location: [facility] OR;  Service: Vascular;  Laterality: Left;  . ENDARTERECTOMY  11/27/2012   Procedure: ENDARTERECTOMY CAROTID;  Surgeon: Nada Libman, MD;  Location: [facility] OR;  Service: Vascular;  Laterality: Right;  . EYE SURGERY     tear duct repair   bilateral  . PATCH ANGIOPLASTY  10/09/2012   Procedure: PATCH ANGIOPLASTY;  Surgeon: Nada Libman, MD;  Location: [facility] OR;  Service: Vascular;  Laterality: Left;  . PATCH ANGIOPLASTY  11/27/2012   Procedure: PATCH ANGIOPLASTY;  Surgeon: Nada Libman, MD;  Location: [facility] OR;  Service: Vascular;  Laterality: Right;  Using Vascu- Guard 1cm x 6cm patch.  . RECTAL PROLAPSE REPAIR     [facility]. Dr. Mirian Mo  . [facility]  March 2015    Social History   Socioeconomic History  . Marital status: Divorced    Spouse name: Not on file  . Number of children: Not on file  . Years of education: Not on file  . Highest education level: Not on file  Occupational History  . Not on file  Tobacco Use  . Smoking status: Never Smoker  . Smokeless tobacco: Never Used  . Tobacco comment: couple oz wine daily  Vaping Use  . Vaping Use: Never used  Substance  and Sexual Activity  . Alcohol use: No    Alcohol/week: 0.0 standard drinks  . Drug use: No  . Sexual activity: Not Currently  Other Topics Concern  . Not on file  Social History Narrative  . Not on file   Social Determinants of Health   Financial Resource Strain: Not on file  Food Insecurity: Not on file  Transportation Needs: Not on file  Physical Activity: Not on file  Stress: Not on file  Social Connections: Not on file  Intimate Partner Violence: Not on file    Family History  Problem Relation Age of Onset  . Hyperlipidemia Mother   . Hypertension Mother   . Diabetes Father   . COPD Father   . Cancer Sister   . Heart attack Sister   .  Stroke Son     Current Outpatient Medications  Medication Sig Dispense Refill  . acetaminophen (TYLENOL) 500 MG tablet Take 500 mg by mouth as needed for mild pain or headache.     . alendronate (FOSAMAX) 70 MG tablet 1 tablet    . ALPRAZolam (XANAX) 0.5 MG tablet Take 0.5 mg by mouth daily.     Marland Kitchen amLODipine (NORVASC) 5 MG tablet Take 5 mg by mouth daily.    Marland Kitchen aspirin 81 MG tablet Take 81 mg by mouth daily.    Marland Kitchen atorvastatin (LIPITOR) 20 MG tablet Take 1 tablet (20 mg total) by mouth daily. Please keep upcoming appt in February 2022 before anymore refills. Thank you 90 tablet 0  . Calcium Carb-Cholecalciferol (CALCIUM + D3) 600-200 MG-UNIT TABS Take 1 tablet by mouth daily.     . diphenhydrAMINE (BENADRYL) 25 mg capsule Take 25 mg by mouth every 6 (six) hours as needed. For allergies    . escitalopram (LEXAPRO) 10 MG tablet Take 10 mg by mouth at bedtime.     . hydroxychloroquine (PLAQUENIL) 200 MG tablet Take 200 mg by mouth daily.    . Multiple Vitamins-Minerals (ICAPS PO) Take 1 tablet by mouth 2 (two) times daily.     Marland Kitchen omeprazole (PRILOSEC) 20 MG capsule   5  . zolpidem (AMBIEN) 5 MG tablet     . cycloSPORINE (RESTASIS) 0.05 % ophthalmic emulsion Place 1 drop into both eyes at bedtime. Emulsion 1 into affected eye (Patient not taking: Reported on 01/05/2021)     No current facility-administered medications for this visit.    Allergies  Allergen Reactions  . Codeine Other (See Comments)    crazy  . Ace Inhibitors     cough  . Angiotensin Receptor Blockers Other (See Comments)     REVIEW OF SYSTEMS:  [X]  denotes positive finding, [ ]  denotes negative finding Cardiac  Comments:  Chest pain or chest pressure:    Shortness of breath upon exertion:    Short of breath when lying flat:    Irregular heart rhythm:        Vascular    Pain in calf, thigh, or hip brought on by ambulation:    Pain in feet at night that wakes you up from your sleep:     Blood clot in your veins:     Leg swelling:         Pulmonary    Oxygen at home:    Productive cough:     Wheezing:         Neurologic    Sudden weakness in arms or legs:     Sudden numbness in arms or legs:  Sudden onset of difficulty speaking or slurred speech:    Temporary loss of vision in one eye:     Problems with dizziness:         Gastrointestinal    Blood in stool:     Vomited blood:         Genitourinary    Burning when urinating:     Blood in urine:        Psychiatric    Major depression:         Hematologic    Bleeding problems:    Problems with blood clotting too easily:        Skin    Rashes or ulcers:        Constitutional    Fever or chills:      PHYSICAL EXAMINATION:  Vitals:   01/05/21 1058 01/05/21 1100  BP: (!) 153/65 (!) 145/72  Pulse: 74   Resp: 20   Temp: 98 F (36.7 C)   TempSrc: Temporal   SpO2: 100%   Weight: 131 lb 12.8 oz (59.8 kg)   Height: 5\' 5"  (1.651 m)     General:  WDWN in NAD; vital signs documented above Gait: Normal HENT: WNL, normocephalic Pulmonary: normal non-labored breathing , without wheezing Cardiac: regular HR, without  Murmurs without carotid bruit Abdomen: soft, NT, no masses Vascular Exam/Pulses:2+ radial pulses bilaterally, 2+ DP and PT pulses bilaterally Extremities: without ischemic changes, without Gangrene , without cellulitis; without open wounds;  Musculoskeletal: no muscle wasting or atrophy  Neurologic: A&O X 3;  No focal weakness or paresthesias are detected Psychiatric:  The pt has Normal affect.   Non-Invasive Vascular Imaging:   01/05/21 VAS Carotid duplex Summary:  Right Carotid: Velocities in the right ICA are consistent with a 1-39% stenosis. The ECA appears >50% stenosed.   Left Carotid: Velocities in the left ICA are consistent with a 1-39% stenosis. Non-hemodynamically significant plaque <50% noted in the CCA.   Vertebrals: Bilateral vertebral arteries demonstrate antegrade flow.  Subclavians: Normal  flow hemodynamics were seen in bilateral subclavian arteries.   ASSESSMENT/PLAN:: 85 y.o. female here for follow up for bilateral carotid artery disease. She has history of bilateral CEA. She has been doing well since. She has no new neurological symptoms. Her duplex today shows 1-39% stenosis bilaterally - She will continue her Aspirin and statin - reviewed signs and symptoms of TIA and stroke with her and she understands should these occur she should seek immediate medical attention -She will follow up in 1 year with repeat carotid duplex   91, PA-C [facility] [phone]  Clinic MD:  Dr. [phone]. Truitt Leep

## 2021-01-08 NOTE — Progress Notes (Signed)
Cardiology Office Note   Date:  01/10/2021   ID:  Shirley Shea, Shirley Shea [birth year], MRN [id number]  PCP:  Ralene Ok, MD    No chief complaint on file.  Carotid disease  Wt Readings from Last 3 Encounters:  01/10/21 131 lb 6.4 oz (59.6 kg)  01/05/21 131 lb 12.8 oz (59.8 kg)  01/06/20 131 lb 11.2 oz (59.7 kg)       History of Present Illness: Shirley Shea is a 85 y.o. female   who has had HTN and renal artery stenosis.  In 2013, she had bilateral carotid stenosis diagnosed and had bilateral CEA. She did very well.  She is getting her routine carotid ultrasounds as well.  In 2018, stoppedNiacin. LDL increased from May 2018 to 12/18 from 77 to 103. She was eating frozen dinners and more pasta for convenience.  Diet improved in 2019. Snacks in the afternoon. No eating after 6:30 PM. One main meal is lunch/brunch. Sticking to more natural foods, especially in the summer.   Since the last visit, she has needed dental work.  She had cataract surgery in 2021.  Denies : Chest pain. Dizziness. Leg edema. Nitroglycerin use. Orthopnea. Paroxysmal nocturnal dyspnea. Shortness of breath. Syncope.   Rare short-lived palpitations.  Exercises in the house.     Past Medical History:  Diagnosis Date  . Allergy   . Anxiety   . Arthritis    hands  . CAD (coronary artery disease)   . Carotid artery occlusion   . Diverticulosis   . GERD (gastroesophageal reflux disease)   . Hiatal hernia   . Hyperlipidemia   . Hypertension   . IBS (irritable bowel syndrome)   . Renal artery stenosis [facility])     Past Surgical History:  Procedure Laterality Date  . ABDOMINAL HYSTERECTOMY    . BREAST SURGERY     implants removed  . ENDARTERECTOMY  10/09/2012   Procedure: ENDARTERECTOMY CAROTID;  Surgeon: Nada Libman, MD;  Location: [facility] OR;  Service: Vascular;  Laterality: Left;  . ENDARTERECTOMY  11/27/2012   Procedure: ENDARTERECTOMY CAROTID;  Surgeon: Nada Libman, MD;   Location[facility] OR;  Service: Vascular;  Laterality: Right;  . EYE SURGERY     tear duct repair   bilateral  . PATCH ANGIOPLASTY  10/09/2012   Procedure: PATCH ANGIOPLASTY;  Surgeon: Nada Libman, MD;  Location: [facility] OR;  Service: Vascular;  Laterality: Left;  . PATCH ANGIOPLASTY  11/27/2012   Procedure: PATCH ANGIOPLASTY;  Surgeon: Nada Libman, MD;  Location: [facility] OR;  Service: Vascular;  Laterality: Right;  Using Vascu- Guard 1cm x 6cm patch.  . RECTAL PROLAPSE REPAIR     [facility]. Dr. Mirian Mo  . [facility]  March 2015     Current Outpatient Medications  Medication Sig Dispense Refill  . acetaminophen (TYLENOL) 500 MG tablet Take 500 mg by mouth as needed for mild pain or headache.     . alendronate (FOSAMAX) 70 MG tablet 1 tablet    . ALPRAZolam (XANAX) 0.5 MG tablet Take 0.5 mg by mouth daily.     Marland Kitchen amLODipine (NORVASC) 5 MG tablet Take 5 mg by mouth daily.    Marland Kitchen aspirin 81 MG tablet Take 81 mg by mouth daily.    Marland Kitchen atorvastatin (LIPITOR) 20 MG tablet Take 1 tablet (20 mg total) by mouth daily. Please keep upcoming appt in February 2022 before anymore refills. Thank you 90 tablet 0  . Calcium Carb-Cholecalciferol (CALCIUM +  D3) 600-200 MG-UNIT TABS Take 1 tablet by mouth daily.     . diphenhydrAMINE (BENADRYL) 25 mg capsule Take 25 mg by mouth every 6 (six) hours as needed. For allergies    . escitalopram (LEXAPRO) 10 MG tablet Take 10 mg by mouth at bedtime.     . hydroxychloroquine (PLAQUENIL) 200 MG tablet Take 200 mg by mouth daily.    . Multiple Vitamins-Minerals (ICAPS PO) Take 1 tablet by mouth 2 (two) times daily.     Marland Kitchen omeprazole (PRILOSEC) 20 MG capsule   5  . zolpidem (AMBIEN) 5 MG tablet      No current facility-administered medications for this visit.    Allergies:   Codeine, Ace inhibitors, and Angiotensin receptor blockers    Social History:  The patient  reports that she has never smoked. She has never used smokeless tobacco. She reports that she does  not drink alcohol and does not use drugs.   Family History:  The patient's family history includes COPD in her father; Cancer in her sister; Diabetes in her father; Heart attack in her sister; Hyperlipidemia in her mother; Hypertension in her mother; Stroke in her son.    ROS:  Please see the history of present illness.   Otherwise, review of systems are positive for occasional fatigue.   All other systems are reviewed and negative.    PHYSICAL EXAM: VS:  BP 128/78   Pulse 83   Ht 5\' 5"  (1.651 m)   Wt 131 lb 6.4 oz (59.6 kg)   SpO2 96%   BMI 21.87 kg/m  , BMI Body mass index is 21.87 kg/m. GEN: Well nourished, well developed, in no acute distress  HEENT: normal  Neck: no JVD, carotid bruits, or masses Cardiac: RRR; no murmurs, rubs, or gallops,no edema  Respiratory:  clear to auscultation bilaterally, normal work of breathing GI: soft, nontender, nondistended, + BS MS: no deformity or atrophy  Skin: warm and dry, no rash Neuro:  Strength and sensation are intact Psych: euthymic mood, full affect   EKG:   The ekg ordered today demonstrates NSR   Recent Labs: No results found for requested labs within last 8760 hours.   Lipid Panel    Component Value Date/Time   CHOL  07/26/2008 0630    175        ATP III CLASSIFICATION:  <200     mg/dL   Desirable  09/25/2008  mg/dL   Borderline High  536-644    mg/dL   High   TRIG 50 >=034 0630   HDL 67 07/26/2008 0630   CHOLHDL 2.6 07/26/2008 0630   VLDL 10 07/26/2008 0630   LDLCALC  07/26/2008 0630    98        Total Cholesterol/HDL:CHD Risk Coronary Heart Disease Risk Table                     Men   Women  1/2 Average Risk   3.4   3.3     Other studies Reviewed: Additional studies/ records that were reviewed today with results demonstrating: labs from PMD reviewed.   ASSESSMENT AND PLAN:  1. Carotid Disease/ S/p CEA/PAD: followed by Doppler at [facility]. 2. HTN: Low salt diet. The current medical regimen is effective;   continue present plan and medications. 3. Hyperlipidemia: LDL 91 in 12/21.  Whole food, plant based diet recommended. COntinue atrovastatin.   4. PACs: Mild sx.   5. She will check her renal function with  her  In March.    Current medicines are reviewed at length with the patient today.  The patient concerns regarding her medicines were addressed.  The following changes have been made:  No change  Labs/ tests ordered today include:  No orders of the defined types were placed in this encounter.   Recommend 150 minutes/week of aerobic exercise Low fat, low carb, high fiber diet recommended  Disposition:   FU in 1 year   Signed, Lance Muss, MD  01/10/2021 9:41 AM    [facility] [address] [place], [place], [place]  [address] Phone: [phone]; Fax: [phone]

## 2021-01-10 ENCOUNTER — Other Ambulatory Visit: Payer: Self-pay

## 2021-01-10 ENCOUNTER — Ambulatory Visit: Payer: Medicare Other | Admitting: Interventional Cardiology

## 2021-01-10 ENCOUNTER — Encounter: Payer: Self-pay | Admitting: Interventional Cardiology

## 2021-01-10 VITALS — BP 128/78 | HR 83 | Ht 65.0 in | Wt 131.4 lb

## 2021-01-10 DIAGNOSIS — I10 Essential (primary) hypertension: Secondary | ICD-10-CM | POA: Diagnosis not present

## 2021-01-10 DIAGNOSIS — Z9889 Other specified postprocedural states: Secondary | ICD-10-CM

## 2021-01-10 DIAGNOSIS — I491 Atrial premature depolarization: Secondary | ICD-10-CM | POA: Diagnosis not present

## 2021-01-10 DIAGNOSIS — I6523 Occlusion and stenosis of bilateral carotid arteries: Secondary | ICD-10-CM | POA: Diagnosis not present

## 2021-01-10 NOTE — Patient Instructions (Signed)
Medication Instructions:  Your physician recommends that you continue on your current medications as directed. Please refer to the Current Medication list given to you today.  *If you need a refill on your cardiac medications before your next appointment, please call your pharmacy*   Lab Work: none If you have labs (blood work) drawn today and your tests are completely normal, you will receive your results only by: . MyChart Message (if you have MyChart) OR . A paper copy in the mail If you have any lab test that is abnormal or we need to change your treatment, we will call you to review the results.   Testing/Procedures: none   Follow-Up: At [facility], you and your health needs are our priority.  As part of our continuing mission to provide you with exceptional heart care, we have created designated Provider Care Teams.  These Care Teams include your primary Cardiologist (physician) and Advanced Practice Providers (APPs -  Physician Assistants and Nurse Practitioners) who all work together to provide you with the care you need, when you need it.  We recommend signing up for the patient portal called "MyChart".  Sign up information is provided on this After Visit Summary.  MyChart is used to connect with patients for Virtual Visits (Telemedicine).  Patients are able to view lab/test results, encounter notes, upcoming appointments, etc.  Non-urgent messages can be sent to your provider as well.   To learn more about what you can do with MyChart, go to [email or web address].    Your next appointment:   12 month(s)  The format for your next appointment:   In Person  Provider:   You may see Dr Varanasi or one of the following Advanced Practice Providers on your designated Care Team:    Dayna Dunn, PA-C  Michele Lenze, PA-C    Other Instructions  High-Fiber Eating Plan Fiber, also called dietary fiber, is a type of carbohydrate. It is found foods such as fruits,  vegetables, whole grains, and beans. A high-fiber diet can have many health benefits. Your health care provider may recommend a high-fiber diet to help:  Prevent constipation. Fiber can make your bowel movements more regular.  Lower your cholesterol.  Relieve the following conditions: ? Inflammation of veins in the anus (hemorrhoids). ? Inflammation of specific areas of the digestive tract (uncomplicated diverticulosis). ? A problem of the large intestine, also called the colon, that sometimes causes pain and diarrhea (irritable bowel syndrome, or IBS).  Prevent overeating as part of a weight-loss plan.  Prevent heart disease, type 2 diabetes, and certain cancers. What are tips for following this plan? Reading food labels  Check the nutrition facts label on food products for the amount of dietary fiber. Choose foods that have 5 grams of fiber or more per serving.  The goals for recommended daily fiber intake include: ? Men (age 50 or younger): 34-38 g. ? Men (over age 50): 28-34 g. ? Women (age 50 or younger): 25-28 g. ? Women (over age 50): 22-25 g. Your daily fiber goal is _____________ g.   Shopping  Choose whole fruits and vegetables instead of processed forms, such as apple juice or applesauce.  Choose a wide variety of high-fiber foods such as avocados, lentils, oats, and kidney beans.  Read the nutrition facts label of the foods you choose. Be aware of foods with added fiber. These foods often have high sugar and sodium amounts per serving. Cooking  Use whole-grain flour for baking and cooking.  Cook   with brown rice instead of white rice. Meal planning  Start the day with a breakfast that is high in fiber, such as a cereal that contains 5 g of fiber or more per serving.  Eat breads and cereals that are made with whole-grain flour instead of refined flour or white flour.  Eat brown rice, bulgur wheat, or millet instead of white rice.  Use beans in place of meat in  soups, salads, and pasta dishes.  Be sure that half of the grains you eat each day are whole grains. General information  You can get the recommended daily intake of dietary fiber by: ? Eating a variety of fruits, vegetables, grains, nuts, and beans. ? Taking a fiber supplement if you are not able to take in enough fiber in your diet. It is better to get fiber through food than from a supplement.  Gradually increase how much fiber you consume. If you increase your intake of dietary fiber too quickly, you may have bloating, cramping, or gas.  Drink plenty of water to help you digest fiber.  Choose high-fiber snacks, such as berries, raw vegetables, nuts, and popcorn. What foods should I eat? Fruits Berries. Pears. Apples. Oranges. Avocado. Prunes and raisins. Dried figs. Vegetables Sweet potatoes. Spinach. Kale. Artichokes. Cabbage. Broccoli. Cauliflower. Green peas. Carrots. Squash. Grains Whole-grain breads. Multigrain cereal. Oats and oatmeal. Brown rice. Barley. Bulgur wheat. Millet. Quinoa. Bran muffins. Popcorn. Rye wafer crackers. Meats and other proteins Navy beans, kidney beans, and pinto beans. Soybeans. Split peas. Lentils. Nuts and seeds. Dairy Fiber-fortified yogurt. Beverages Fiber-fortified soy milk. Fiber-fortified orange juice. Other foods Fiber bars. The items listed above may not be a complete list of recommended foods and beverages. Contact a dietitian for more information. What foods should I avoid? Fruits Fruit juice. Cooked, strained fruit. Vegetables Fried potatoes. Canned vegetables. Well-cooked vegetables. Grains White bread. Pasta made with refined flour. White rice. Meats and other proteins Fatty cuts of meat. Fried chicken or fried fish. Dairy Milk. Yogurt. Cream cheese. Sour cream. Fats and oils Butters. Beverages Soft drinks. Other foods Cakes and pastries. The items listed above may not be a complete list of foods and beverages to avoid.  Talk with your dietitian about what choices are best for you. Summary  Fiber is a type of carbohydrate. It is found in foods such as fruits, vegetables, whole grains, and beans.  A high-fiber diet has many benefits. It can help to prevent constipation, lower blood cholesterol, aid weight loss, and reduce your risk of heart disease, diabetes, and certain cancers.  Increase your intake of fiber gradually. Increasing fiber too quickly may cause cramping, bloating, and gas. Drink plenty of water while you increase the amount of fiber you consume.  The best sources of fiber include whole fruits and vegetables, whole grains, nuts, seeds, and beans. This information is not intended to replace advice given to you by your health care provider. Make sure you discuss any questions you have with your health care provider. Document Revised: 03/10/2020 Document Reviewed: 03/10/2020 Elsevier Patient Education  2021 [employer]   

## 2021-03-23 ENCOUNTER — Other Ambulatory Visit (HOSPITAL_COMMUNITY): Payer: Self-pay

## 2021-03-23 ENCOUNTER — Other Ambulatory Visit: Payer: Self-pay | Admitting: Interventional Cardiology

## 2021-03-23 MED ORDER — ESCITALOPRAM OXALATE 10 MG PO TABS
ORAL_TABLET | Freq: Every day | ORAL | 1 refills | Status: DC
  Filled 2021-03-23: qty 90, 90d supply, fill #0
  Filled 2021-06-21: qty 90, 90d supply, fill #1

## 2021-03-23 MED ORDER — ZOLPIDEM TARTRATE 5 MG PO TABS
ORAL_TABLET | ORAL | 0 refills | Status: DC
  Filled 2021-03-23: qty 90, 90d supply, fill #0

## 2021-03-23 MED ORDER — AMLODIPINE BESYLATE 5 MG PO TABS
ORAL_TABLET | Freq: Every day | ORAL | 2 refills | Status: DC
  Filled 2021-03-23: qty 90, 90d supply, fill #0
  Filled 2021-07-11: qty 90, 90d supply, fill #1
  Filled 2021-10-18: qty 90, 90d supply, fill #2

## 2021-03-23 MED ORDER — ATORVASTATIN CALCIUM 20 MG PO TABS
20.0000 mg | ORAL_TABLET | Freq: Every day | ORAL | 0 refills | Status: DC
  Filled 2021-03-23: qty 90, 90d supply, fill #0

## 2021-03-27 ENCOUNTER — Other Ambulatory Visit (HOSPITAL_COMMUNITY): Payer: Self-pay

## 2021-03-27 MED ORDER — ALENDRONATE SODIUM 70 MG PO TABS
ORAL_TABLET | ORAL | 3 refills | Status: DC
  Filled 2021-03-27: qty 4, 28d supply, fill #0
  Filled 2021-09-20: qty 4, 28d supply, fill #1

## 2021-03-28 ENCOUNTER — Other Ambulatory Visit (HOSPITAL_COMMUNITY): Payer: Self-pay

## 2021-04-04 ENCOUNTER — Other Ambulatory Visit (HOSPITAL_COMMUNITY): Payer: Self-pay

## 2021-04-27 ENCOUNTER — Other Ambulatory Visit (HOSPITAL_COMMUNITY): Payer: Self-pay

## 2021-04-27 MED ORDER — ALPRAZOLAM 0.5 MG PO TABS
ORAL_TABLET | ORAL | 1 refills | Status: AC
  Filled 2021-04-27: qty 180, 90d supply, fill #0
  Filled 2021-07-26: qty 180, 90d supply, fill #1

## 2021-04-27 MED ORDER — ALENDRONATE SODIUM 70 MG PO TABS
ORAL_TABLET | ORAL | 3 refills | Status: DC
  Filled 2021-04-27: qty 12, 84d supply, fill #0
  Filled 2021-07-26: qty 12, 84d supply, fill #1

## 2021-04-27 MED FILL — Hydroxychloroquine Sulfate Tab 200 MG: ORAL | 90 days supply | Qty: 90 | Fill #0 | Status: AC

## 2021-05-01 ENCOUNTER — Other Ambulatory Visit (HOSPITAL_COMMUNITY): Payer: Self-pay

## 2021-06-21 ENCOUNTER — Other Ambulatory Visit (HOSPITAL_COMMUNITY): Payer: Self-pay

## 2021-06-21 MED ORDER — ZOLPIDEM TARTRATE 5 MG PO TABS
ORAL_TABLET | ORAL | 0 refills | Status: DC
  Filled 2021-06-21: qty 90, 90d supply, fill #0

## 2021-06-28 ENCOUNTER — Other Ambulatory Visit (HOSPITAL_COMMUNITY): Payer: Self-pay

## 2021-06-28 MED ORDER — TIZANIDINE HCL 2 MG PO TABS
ORAL_TABLET | ORAL | 0 refills | Status: AC
  Filled 2021-06-28: qty 30, 10d supply, fill #0

## 2021-07-05 ENCOUNTER — Other Ambulatory Visit (HOSPITAL_COMMUNITY): Payer: Self-pay

## 2021-07-11 ENCOUNTER — Other Ambulatory Visit (HOSPITAL_COMMUNITY): Payer: Self-pay

## 2021-07-11 ENCOUNTER — Other Ambulatory Visit: Payer: Self-pay | Admitting: Interventional Cardiology

## 2021-07-11 MED ORDER — ATORVASTATIN CALCIUM 20 MG PO TABS
20.0000 mg | ORAL_TABLET | Freq: Every day | ORAL | 1 refills | Status: DC
  Filled 2021-07-11: qty 90, 90d supply, fill #0
  Filled 2021-10-18: qty 90, 90d supply, fill #1

## 2021-07-26 ENCOUNTER — Other Ambulatory Visit (HOSPITAL_COMMUNITY): Payer: Self-pay

## 2021-07-26 MED ORDER — HYDROXYCHLOROQUINE SULFATE 200 MG PO TABS
ORAL_TABLET | Freq: Every day | ORAL | 1 refills | Status: AC
  Filled 2021-07-26: qty 90, 90d supply, fill #0
  Filled 2021-10-18: qty 90, 90d supply, fill #1

## 2021-09-20 ENCOUNTER — Other Ambulatory Visit (HOSPITAL_COMMUNITY): Payer: Self-pay

## 2021-09-20 MED ORDER — ESCITALOPRAM OXALATE 10 MG PO TABS
ORAL_TABLET | Freq: Every day | ORAL | 1 refills | Status: DC
  Filled 2021-09-20: qty 90, 90d supply, fill #0

## 2021-09-20 MED ORDER — ZOLPIDEM TARTRATE 5 MG PO TABS
ORAL_TABLET | ORAL | 0 refills | Status: AC
  Filled 2021-09-20: qty 90, 90d supply, fill #0

## 2021-09-21 ENCOUNTER — Other Ambulatory Visit (HOSPITAL_COMMUNITY): Payer: Self-pay

## 2021-09-26 ENCOUNTER — Other Ambulatory Visit (HOSPITAL_COMMUNITY): Payer: Self-pay

## 2021-09-26 MED ORDER — OMEPRAZOLE 20 MG PO CPDR
DELAYED_RELEASE_CAPSULE | Freq: Every day | ORAL | 3 refills | Status: AC
  Filled 2021-09-26: qty 30, 30d supply, fill #0

## 2021-09-27 ENCOUNTER — Other Ambulatory Visit (HOSPITAL_COMMUNITY): Payer: Self-pay

## 2021-10-11 ENCOUNTER — Other Ambulatory Visit (HOSPITAL_COMMUNITY): Payer: Self-pay

## 2021-10-18 ENCOUNTER — Other Ambulatory Visit (HOSPITAL_COMMUNITY): Payer: Self-pay

## 2021-11-06 ENCOUNTER — Other Ambulatory Visit (HOSPITAL_COMMUNITY): Payer: Self-pay

## 2021-11-10 ENCOUNTER — Other Ambulatory Visit (HOSPITAL_COMMUNITY): Payer: Self-pay

## 2022-01-03 NOTE — Progress Notes (Signed)
Cardiology Office Note   Date:  01/05/2022   ID:  Shirley, Shea 16-Dec-1935, MRN [id number]  PCP:  Shirley Ok, MD    No chief complaint on file.  PAD  Wt Readings from Last 3 Encounters:  01/05/22 128 lb 6.4 oz (58.2 kg)  01/10/21 131 lb 6.4 oz (59.6 kg)  01/05/21 131 lb 12.8 oz (59.8 kg)       History of Present Illness: Shirley Shea is a 86 y.o. female   who has had HTN and renal artery stenosis.   In 2013, she had bilateral carotid stenosis diagnosed and had bilateral CEA.  She did very well.   She is getting her routine carotid ultrasounds as well.   In 2018, stopped Niacin.  LDL increased from May 2018 to 12/18 from 77 to 103.  She was eating frozen dinners and more pasta for convenience.    Diet improved in 2019.  Snacks in the afternoon.  No eating after 6:30 PM.  One main meal is lunch/brunch.  Sticking to more natural foods, especially in the summer.    She has needed dental work.  She had cataract surgery in 2021.  She had COVID in June 2022.  URI sx.    Denies : Chest pain. Dizziness. Leg edema. Nitroglycerin use. Orthopnea. Palpitations. Paroxysmal nocturnal dyspnea. Shortness of breath. Syncope.   Eats a lot of salads and fruit.  Avoids red meat.  Walks nearly daily.  Feels good with walking.   Past Medical History:  Diagnosis Date   Allergy    Anxiety    Arthritis    hands   CAD (coronary artery disease)    Carotid artery occlusion    Diverticulosis    GERD (gastroesophageal reflux disease)    Hiatal hernia    Hyperlipidemia    Hypertension    IBS (irritable bowel syndrome)    Renal artery stenosis (HCC)     Past Surgical History:  Procedure Laterality Date   ABDOMINAL HYSTERECTOMY     BREAST SURGERY     implants removed   ENDARTERECTOMY  10/09/2012   Procedure: ENDARTERECTOMY CAROTID;  Surgeon: Shirley Libman, MD;  Location: [facility] OR;  Service: Vascular;  Laterality: Left;   ENDARTERECTOMY  11/27/2012   Procedure:  ENDARTERECTOMY CAROTID;  Surgeon: Shirley Libman, MD;  Location: [facility] OR;  Service: Vascular;  Laterality: Right;   EYE SURGERY     tear duct repair   bilateral   PATCH ANGIOPLASTY  10/09/2012   Procedure: PATCH ANGIOPLASTY;  Surgeon: Shirley Libman, MD;  Location: [facility] OR;  Service: Vascular;  Laterality: Left;   PATCH ANGIOPLASTY  11/27/2012   Procedure: PATCH ANGIOPLASTY;  Surgeon: Shirley Libman, MD;  Location: [facility] OR;  Service: Vascular;  Laterality: Right;  Using Vascu- Guard 1cm x 6cm patch.   RECTAL PROLAPSE REPAIR     [facility]. Dr. Mirian Shea   [facility]  March 2015     Current Outpatient Medications  Medication Sig Dispense Refill   acetaminophen (TYLENOL) 500 MG tablet Take 500 mg by mouth as needed for mild pain or headache.      ALPRAZolam (XANAX) 0.5 MG tablet Take one tablet by mouth twice every day 180 tablet 1   amLODipine (NORVASC) 5 MG tablet Take 5 mg by mouth daily.     aspirin 81 MG tablet Take 81 mg by mouth daily.     atorvastatin (LIPITOR) 20 MG tablet Take 1 tablet (20 mg total)  by mouth daily. Please keep upcoming appt in February 2022 before anymore refills. Thank you 90 tablet 0   atorvastatin (LIPITOR) 20 MG tablet Take 1 tablet (20 mg total) by mouth daily. 90 tablet 1   Calcium Carb-Cholecalciferol (CALCIUM + D3) 600-200 MG-UNIT TABS Take 1 tablet by mouth daily.      diphenhydrAMINE (BENADRYL) 25 mg capsule Take 25 mg by mouth every 6 (six) hours as needed. For allergies     escitalopram (LEXAPRO) 10 MG tablet Take 10 mg by mouth at bedtime.      hydroxychloroquine (PLAQUENIL) 200 MG tablet TAKE ONE TABLET BY MOUTH EVERY DAY 90 tablet 1   Multiple Vitamins-Minerals (ICAPS PO) Take 1 tablet by mouth 2 (two) times daily.      omeprazole (PRILOSEC) 20 MG capsule TAKE ONE CAPSULE BY MOUTH DAILY (Patient taking differently: Take by mouth as needed.) 30 capsule 3   tiZANidine (ZANAFLEX) 2 MG tablet Take 1 tablet by mouth every 8 hours as needed. 30 tablet 0    zolpidem (AMBIEN) 5 MG tablet TAKE ONE TABLET BY MOUTH EVERY NIGHT AT BEDTIME AS NEEDED 90 tablet 0   No current facility-administered medications for this visit.    Allergies:   Codeine, Ace inhibitors, and Angiotensin receptor blockers    Social History:  The patient  reports that she has never smoked. She has never used smokeless tobacco. She reports that she does not drink alcohol and does not use drugs.   Family History:  The patient's family history includes COPD in her father; Cancer in her sister; Diabetes in her father; Heart attack in her sister; Hyperlipidemia in her mother; Hypertension in her mother; Stroke in her son.    ROS:  Please see the history of present illness.   Otherwise, review of systems are positive for slightly more unsteady when walking, she is more careful.   All other systems are reviewed and negative.    PHYSICAL EXAM: VS:  BP 118/64    Pulse 72    Ht 5\' 5"  (1.651 m)    Wt 128 lb 6.4 oz (58.2 kg)    SpO2 97%    BMI 21.37 kg/m  , BMI Body mass index is 21.37 kg/m. GEN: Well nourished, well developed, in no acute distress HEENT: normal Neck: no JVD, carotid bruits, or masses Cardiac:  RRR; no murmurs, rubs, or gallops,no edema  Respiratory:  clear to auscultation bilaterally, normal work of breathing GI: soft, nontender, nondistended, + BS MS: no deformity or atrophy, 2+ PT pulses bilaterally Skin: warm and dry, no rash Neuro:  Strength and sensation are intact Psych: euthymic mood, full affect   EKG:   The ekg ordered today demonstrates NSR, no ST changes   Recent Labs: No results found for requested labs within last 8760 hours.   Lipid Panel    Component Value Date/Time   CHOL  07/26/2008 0630    175        ATP III CLASSIFICATION:  <200     mg/dL   Desirable  09/25/2008  mg/dL   Borderline High  893-810    mg/dL   High   TRIG 50 >=175 0630   HDL 67 07/26/2008 0630   CHOLHDL 2.6 07/26/2008 0630   VLDL 10 07/26/2008 0630   LDLCALC   07/26/2008 0630    98        Total Cholesterol/HDL:CHD Risk Coronary Heart Disease Risk Table  Men   Women  1/2 Average Risk   3.4   3.3     Other studies Reviewed: Additional studies/ records that were reviewed today with results demonstrating: LDL 96, HDL 67 in April 2022.  Labs checked with primary care doctor.   ASSESSMENT AND PLAN:  Carotid disease; s/p CEA/PAD: Followed by [facility] with regular ultrasounds given prior carotid endarterectomy.  Neck scan in a few weeks. HTN: Low-salt diet.  Avoid processed foods.  The current medical regimen is effective;  continue present plan and medications. Hyperlipidemia: Whole food, plant-based diet.  Increase atorvastatin to 40 mg daily given her PAD.  Would like to see her LDL around 70.  She will have labs with her primary care doctor coming up in a few months.  She will need lipids and liver tests at that time. PACs:  no symptoms.    Current medicines are reviewed at length with the patient today.  The patient concerns regarding her medicines were addressed.  The following changes have been made:  No change  Labs/ tests ordered today include:  No orders of the defined types were placed in this encounter.   Recommend 150 minutes/week of aerobic exercise Low fat, low carb, high fiber diet recommended  Disposition:   FU in 1 year   Signed, Lance Muss, MD  01/05/2022 9:20 AM    [facility] [address] [place], [place], [place]  [address] Phone: [phone]; Fax: [phone]

## 2022-01-05 ENCOUNTER — Encounter: Payer: Self-pay | Admitting: Interventional Cardiology

## 2022-01-05 ENCOUNTER — Ambulatory Visit: Payer: Medicare Other | Admitting: Interventional Cardiology

## 2022-01-05 ENCOUNTER — Other Ambulatory Visit: Payer: Self-pay

## 2022-01-05 VITALS — BP 118/64 | HR 72 | Ht 65.0 in | Wt 128.4 lb

## 2022-01-05 DIAGNOSIS — I6523 Occlusion and stenosis of bilateral carotid arteries: Secondary | ICD-10-CM | POA: Diagnosis not present

## 2022-01-05 DIAGNOSIS — Z9889 Other specified postprocedural states: Secondary | ICD-10-CM

## 2022-01-05 DIAGNOSIS — I491 Atrial premature depolarization: Secondary | ICD-10-CM

## 2022-01-05 DIAGNOSIS — I10 Essential (primary) hypertension: Secondary | ICD-10-CM

## 2022-01-05 MED ORDER — ATORVASTATIN CALCIUM 40 MG PO TABS: 40.0000 mg | ORAL_TABLET | Freq: Every day | ORAL | 3 refills | Status: DC

## 2022-01-05 NOTE — Patient Instructions (Signed)
Medication Instructions:  Your physician has recommended you make the following change in your medication: Increase Atorvastatin to 40 mg by mouth daily  *If you need a refill on your cardiac medications before your next appointment, please call your pharmacy*   Lab Work: Have lipid and liver profiles checked by your primary care provider at appointment in April or May If you have labs (blood work) drawn today and your tests are completely normal, you will receive your results only by: [employer] (if you have MyChart) OR A paper copy in the mail If you have any lab test that is abnormal or we need to change your treatment, we will call you to review the results.   Testing/Procedures: none   Follow-Up: At [facility], you and your health needs are our priority.  As part of our continuing mission to provide you with exceptional heart care, we have created designated Provider Care Teams.  These Care Teams include your primary Cardiologist (physician) and Advanced Practice Providers (APPs -  Physician Assistants and Nurse Practitioners) who all work together to provide you with the care you need, when you need it.  We recommend signing up for the patient portal called "MyChart".  Sign up information is provided on this After Visit Summary.  MyChart is used to connect with patients for Virtual Visits (Telemedicine).  Patients are able to view lab/test results, encounter notes, upcoming appointments, etc.  Non-urgent messages can be sent to your provider as well.   To learn more about what you can do with MyChart, go to [email or web address].    Your next appointment:   12 month(s)  The format for your next appointment:   In Person  Provider:   Lance Muss, MD     Other Instructions

## 2022-01-13 ENCOUNTER — Other Ambulatory Visit: Payer: Self-pay

## 2022-01-13 DIAGNOSIS — I6523 Occlusion and stenosis of bilateral carotid arteries: Secondary | ICD-10-CM

## 2022-01-15 ENCOUNTER — Ambulatory Visit: Payer: Medicare Other | Admitting: Physician Assistant

## 2022-01-15 ENCOUNTER — Ambulatory Visit (HOSPITAL_COMMUNITY)
Admission: RE | Admit: 2022-01-15 | Discharge: 2022-01-15 | Disposition: A | Discharge status: Home / Self Care | Payer: Medicare Other | Source: Ambulatory Visit | Attending: Surgery | Admitting: Surgery

## 2022-01-15 ENCOUNTER — Other Ambulatory Visit: Payer: Self-pay

## 2022-01-15 VITALS — BP 150/71 | HR 72 | Temp 97.8°F | Resp 20 | Ht 65.0 in | Wt 128.0 lb

## 2022-01-15 DIAGNOSIS — I6523 Occlusion and stenosis of bilateral carotid arteries: Secondary | ICD-10-CM

## 2022-01-15 NOTE — Progress Notes (Signed)
Office Note     CC:  follow up Requesting Provider:  Jilda Panda, MD  HPI: Shirley Shea is a 86 y.o. (14-Feb-1936) female who presents for follow up of carotid artery disease. She has remote history of bilateral endarterectomies, right 10/09/12 and left 11/27/12 by Dr. Trula Slade These were both performed for asymptomatic high grade stenosis.   She continues to have weak but functional vocal cords with hoarseness, but this is unchanged from prior visits. She mostly notices this if she is speaking for a prolonged period of time. She denies any upper or lower extremity weakness or numbness, slurred speech, amaurosis or visual changes. She did have cataract surgery to bilateral eyes in the summer of 2021 and her vision is now much better. She still suffers from macular degeneration so she says her eyes are as good as they are going to be  She continues to taker her Aspirin and statin medication   Her medical history is otherwise unchanged from her prior visits. She remains active walking and exercising several days a week.    The pt is on a statin for cholesterol management.  The pt is on a daily aspirin.   Other AC:  none The pt is on amlodipine for hypertension.   The pt is not diabetic.   Tobacco hx:  never  Past Medical History:  Diagnosis Date   Allergy    Anxiety    Arthritis    hands   CAD (coronary artery disease)    Carotid artery occlusion    Diverticulosis    GERD (gastroesophageal reflux disease)    Hiatal hernia    Hyperlipidemia    Hypertension    IBS (irritable bowel syndrome)    Renal artery stenosis (HCC)     Past Surgical History:  Procedure Laterality Date   ABDOMINAL HYSTERECTOMY     BREAST SURGERY     implants removed   ENDARTERECTOMY  10/09/2012   Procedure: ENDARTERECTOMY CAROTID;  Surgeon: Serafina Mitchell, MD;  Location: [place];  Service: Vascular;  Laterality: Left;   ENDARTERECTOMY  11/27/2012   Procedure: ENDARTERECTOMY CAROTID;  Surgeon: Serafina Mitchell, MD;  Location: [place];  Service: Vascular;  Laterality: Right;   EYE SURGERY     tear duct repair   bilateral   PATCH ANGIOPLASTY  10/09/2012   Procedure: PATCH ANGIOPLASTY;  Surgeon: Serafina Mitchell, MD;  Location: [place];  Service: Vascular;  Laterality: Left;   PATCH ANGIOPLASTY  11/27/2012   Procedure: PATCH ANGIOPLASTY;  Surgeon: Serafina Mitchell, MD;  Location: [facility] OR;  Service: Vascular;  Laterality: Right;  Using Vascu- Guard 1cm x 6cm patch.   RECTAL PROLAPSE REPAIR     [facility]. Dr. Lenise Arena   [facility]  March 2015    Social History   Socioeconomic History   Marital status: Divorced    Spouse name: Not on file   Number of children: Not on file   Years of education: Not on file   Highest education level: Not on file  Occupational History   Not on file  Tobacco Use   Smoking status: Never    Passive exposure: Never   Smokeless tobacco: Never   Tobacco comments:    couple oz wine daily  Vaping Use   Vaping Use: Never used  Substance and Sexual Activity   Alcohol use: No    Alcohol/week: 0.0 standard drinks   Drug use: No   Sexual activity: Not Currently  Other Topics  Concern   Not on file  Social History Narrative   Not on file   Social Determinants of Health   Financial Resource Strain: Not on file  Food Insecurity: Not on file  Transportation Needs: Not on file  Physical Activity: Not on file  Stress: Not on file  Social Connections: Not on file  Intimate Partner Violence: Not on file    Family History  Problem Relation Age of Onset   Hyperlipidemia Mother    Hypertension Mother    Diabetes Father    COPD Father    Cancer Sister    Heart attack Sister    Stroke Son     Current Outpatient Medications  Medication Sig Dispense Refill   acetaminophen (TYLENOL) 500 MG tablet Take 500 mg by mouth as needed for mild pain or headache.      ALPRAZolam (XANAX) 0.5 MG tablet Take one tablet by mouth twice every day 180 tablet 1    amLODipine (NORVASC) 5 MG tablet Take 5 mg by mouth daily.     aspirin 81 MG tablet Take 81 mg by mouth daily.     atorvastatin (LIPITOR) 40 MG tablet Take 1 tablet (40 mg total) by mouth daily. 90 tablet 3   Calcium Carb-Cholecalciferol (CALCIUM + D3) 600-200 MG-UNIT TABS Take 1 tablet by mouth daily.      diphenhydrAMINE (BENADRYL) 25 mg capsule Take 25 mg by mouth every 6 (six) hours as needed. For allergies     escitalopram (LEXAPRO) 10 MG tablet Take 10 mg by mouth at bedtime.      hydroxychloroquine (PLAQUENIL) 200 MG tablet TAKE ONE TABLET BY MOUTH EVERY DAY 90 tablet 1   Multiple Vitamins-Minerals (ICAPS PO) Take 1 tablet by mouth 2 (two) times daily.      omeprazole (PRILOSEC) 20 MG capsule TAKE ONE CAPSULE BY MOUTH DAILY (Patient taking differently: Take by mouth as needed.) 30 capsule 3   tiZANidine (ZANAFLEX) 2 MG tablet Take 1 tablet by mouth every 8 hours as needed. 30 tablet 0   zolpidem (AMBIEN) 5 MG tablet TAKE ONE TABLET BY MOUTH EVERY NIGHT AT BEDTIME AS NEEDED 90 tablet 0   No current facility-administered medications for this visit.    Allergies  Allergen Reactions   Codeine Other (See Comments)    crazy   Ace Inhibitors     cough   Angiotensin Receptor Blockers Other (See Comments)     REVIEW OF SYSTEMS:  [X]  denotes positive finding, [ ]  denotes negative finding Cardiac  Comments:  Chest pain or chest pressure:    Shortness of breath upon exertion:    Short of breath when lying flat:    Irregular heart rhythm:        Vascular    Pain in calf, thigh, or hip brought on by ambulation:    Pain in feet at night that wakes you up from your sleep:     Blood clot in your veins:    Leg swelling:         Pulmonary    Oxygen at home:    Productive cough:     Wheezing:         Neurologic    Sudden weakness in arms or legs:     Sudden numbness in arms or legs:     Sudden onset of difficulty speaking or slurred speech:    Temporary loss of vision in one eye:      Problems with dizziness:  Gastrointestinal    Blood in stool:     Vomited blood:         Genitourinary    Burning when urinating:     Blood in urine:        Psychiatric    Major depression:         Hematologic    Bleeding problems:    Problems with blood clotting too easily:        Skin    Rashes or ulcers:        Constitutional    Fever or chills:      PHYSICAL EXAMINATION:  Vitals:   01/15/22 1130 01/15/22 1132  BP: 130/74 (!) 150/71  Pulse: 72   Resp: 20   Temp: 97.8 F (36.6 C)   TempSrc: Temporal   SpO2: 100%   Weight: 128 lb (58.1 kg)   Height: 5\' 5"  (1.651 m)     General:  WDWN in NAD; vital signs documented above Gait: Normal HENT: WNL, normocephalic Pulmonary: normal non-labored breathing , without Rales, rhonchi,  wheezing Cardiac: regular HR, without  Murmurs without carotid bruit Abdomen: flat, soft, NT, no masses Vascular Exam/Pulses:  Right Left  Radial 2+ (normal) 2+ (normal)  Femoral 2+ (normal) 2+ (normal)  Popliteal 2+ (normal) 2+ (normal)  DP 2+ (normal) 2+ (normal)  PT 1+ (weak) 1+ (weak)   Extremities: without ischemic changes, without Gangrene , without cellulitis; without open wounds;  Musculoskeletal: no muscle wasting or atrophy  Neurologic: A&O X 3;  No focal weakness or paresthesias are detected Psychiatric:  The pt has Normal affect.   Non-Invasive Vascular Imaging:   VAS Carotid Duplex:01/15/22 Summary:  Right Carotid: Velocities in the right ICA are consistent with a 1-39% stenosis. The ECA appears >50% stenosed.   Left Carotid: Velocities in the left ICA are consistent with a 1-39% stenosis.   Vertebrals:  Bilateral vertebral arteries demonstrate antegrade flow.  Subclavians: Normal flow hemodynamics were seen in bilateral subclavian arteries.    ASSESSMENT/PLAN:: 86 y.o. female here for follow up for Carotid artery disease. She has remote history of bilateral endarterectomies, right 10/09/12 and left 11/27/12  by Dr. Trula Slade These were both performed for asymptomatic high grade stenosis. She has no new neurological symptoms. Her duplex today shows 1-39% stenosis bilaterally. Normal flow in vertebral and subclavian arteries.  - Continue  Aspirin and Statin - Reviewed signs and symptoms of TIA/ Stroke and she understands should this occur to seek immediate medical attention -she will follow up in 1 year with repeat carotid Duplex   Karoline Caldwell, PA-C [facility] [phone]  Clinic MD:   Dr. Trula Slade

## 2023-01-25 ENCOUNTER — Other Ambulatory Visit: Payer: Self-pay | Admitting: Interventional Cardiology

## 2023-03-01 ENCOUNTER — Other Ambulatory Visit: Payer: Self-pay | Admitting: *Deleted

## 2023-03-01 DIAGNOSIS — I6523 Occlusion and stenosis of bilateral carotid arteries: Secondary | ICD-10-CM

## 2023-03-11 ENCOUNTER — Ambulatory Visit (HOSPITAL_COMMUNITY)
Admission: RE | Admit: 2023-03-11 | Discharge: 2023-03-11 | Disposition: A | Discharge status: Home / Self Care | Payer: Medicare HMO | Source: Ambulatory Visit | Attending: Vascular Surgery | Admitting: Vascular Surgery

## 2023-03-11 ENCOUNTER — Ambulatory Visit: Payer: Medicare HMO | Admitting: Physician Assistant

## 2023-03-11 VITALS — BP 120/62 | HR 62 | Temp 97.7°F | Wt 128.0 lb

## 2023-03-11 DIAGNOSIS — I6523 Occlusion and stenosis of bilateral carotid arteries: Secondary | ICD-10-CM | POA: Insufficient documentation

## 2023-03-11 NOTE — Progress Notes (Signed)
Office Note     CC:  follow up Requesting Provider:  Ralene Ok, MD  HPI: Shirley Shea is a 87 y.o. (08-Aug-1936) female who presents for routine follow up of carotid artery stenosis. She has remote history of bilateral carotid endarterectomies,right 10/09/12 and left 11/27/12 by Dr. Myra Gianotti These were both performed for asymptomatic high grade stenosis.   She has no concerns today. She denies any upper or lower extremity weakness or numbness, slurred speech, amaurosis or visual changes. She has had cataract surgery to bilateral eyes suffers from macular degeneration so she says her eye sight is not good however has not acutely changed. She continues to have weak but functional vocal cords with hoarseness, but this is unchanged from prior visits. She mostly notices this if she is speaking for a prolonged period of time. She continues to take her Aspirin and statin medication   Her medical history is otherwise pretty much unchanged since her last visit. She explains that she has had several dental procedures/ surgeries over past year but otherwise no changes. She remains active walking and exercising several days a week.  The pt is on a statin for cholesterol management.  The pt is on a daily aspirin.   Other AC:  none The pt is on amlodipine for hypertension.   The pt is not diabetic.   Tobacco hx:  never  Past Medical History:  Diagnosis Date   Allergy    Anxiety    Arthritis    hands   CAD (coronary artery disease)    Carotid artery occlusion    Diverticulosis    GERD (gastroesophageal reflux disease)    Hiatal hernia    Hyperlipidemia    Hypertension    IBS (irritable bowel syndrome)    Renal artery stenosis     Past Surgical History:  Procedure Laterality Date   ABDOMINAL HYSTERECTOMY     BREAST SURGERY     implants removed   ENDARTERECTOMY  10/09/2012   Procedure: ENDARTERECTOMY CAROTID;  Surgeon: Nada Libman, MD;  Location: [facility] OR;  Service: Vascular;   Laterality: Left;   ENDARTERECTOMY  11/27/2012   Procedure: ENDARTERECTOMY CAROTID;  Surgeon: Nada Libman, MD;  Location: [facility] OR;  Service: Vascular;  Laterality: Right;   EYE SURGERY     tear duct repair   bilateral   PATCH ANGIOPLASTY  10/09/2012   Procedure: PATCH ANGIOPLASTY;  Surgeon: Nada Libman, MD;  Location: [facility] OR;  Service: Vascular;  Laterality: Left;   PATCH ANGIOPLASTY  11/27/2012   Procedure: PATCH ANGIOPLASTY;  Surgeon: Nada Libman, MD;  Location: [facility] OR;  Service: Vascular;  Laterality: Right;  Using Vascu- Guard 1cm x 6cm patch.   RECTAL PROLAPSE REPAIR     [facility] Dr. Mirian Mo   [facility]  March 2015    Social History   Socioeconomic History   Marital status: Divorced    Spouse name: Not on file   Number of children: Not on file   Years of education: Not on file   Highest education level: Not on file  Occupational History   Not on file  Tobacco Use   Smoking status: Never    Passive exposure: Never   Smokeless tobacco: Never   Tobacco comments:    couple oz wine daily  Vaping Use   Vaping Use: Never used  Substance and Sexual Activity   Alcohol use: No    Alcohol/week: 0.0 standard drinks of alcohol   Drug use: No  Sexual activity: Not Currently  Other Topics Concern   Not on file  Social History Narrative   Not on file   Social Determinants of Health   Financial Resource Strain: Not on file  Food Insecurity: Not on file  Transportation Needs: Not on file  Physical Activity: Not on file  Stress: Not on file  Social Connections: Not on file  Intimate Partner Violence: Not on file    Family History  Problem Relation Age of Onset   Hyperlipidemia Mother    Hypertension Mother    Diabetes Father    COPD Father    Cancer Sister    Heart attack Sister    Stroke Son     Current Outpatient Medications  Medication Sig Dispense Refill   acetaminophen (TYLENOL) 500 MG tablet Take 500 mg by mouth as needed for mild pain or  headache.      ALPRAZolam (XANAX) 0.5 MG tablet Take one tablet by mouth twice every day 180 tablet 1   amLODipine (NORVASC) 5 MG tablet Take 5 mg by mouth daily.     aspirin 81 MG tablet Take 81 mg by mouth daily.     atorvastatin (LIPITOR) 40 MG tablet Take 1 tablet by mouth once daily 90 tablet 1   Calcium Carb-Cholecalciferol (CALCIUM + D3) 600-200 MG-UNIT TABS Take 1 tablet by mouth daily.      dapagliflozin propanediol (FARXIGA) 10 MG TABS tablet Take 10 mg by mouth daily.     diphenhydrAMINE (BENADRYL) 25 mg capsule Take 25 mg by mouth every 6 (six) hours as needed. For allergies     escitalopram (LEXAPRO) 10 MG tablet Take 10 mg by mouth at bedtime.      Multiple Vitamins-Minerals (ICAPS PO) Take 1 tablet by mouth 2 (two) times daily.      tiZANidine (ZANAFLEX) 2 MG tablet Take 1 tablet by mouth every 8 hours as needed. 30 tablet 0   zolpidem (AMBIEN) 5 MG tablet TAKE ONE TABLET BY MOUTH EVERY NIGHT AT BEDTIME AS NEEDED 90 tablet 0   omeprazole (PRILOSEC) 20 MG capsule TAKE ONE CAPSULE BY MOUTH DAILY (Patient taking differently: Take by mouth as needed.) 30 capsule 3   No current facility-administered medications for this visit.    Allergies  Allergen Reactions   Codeine Other (See Comments)    crazy   Ace Inhibitors     cough   Angiotensin Receptor Blockers Other (See Comments)     REVIEW OF SYSTEMS:  [X]  denotes positive finding, [ ]  denotes negative finding Cardiac  Comments:  Chest pain or chest pressure:    Shortness of breath upon exertion:    Short of breath when lying flat:    Irregular heart rhythm:        Vascular    Pain in calf, thigh, or hip brought on by ambulation:    Pain in feet at night that wakes you up from your sleep:     Blood clot in your veins:    Leg swelling:         Pulmonary    Oxygen at home:    Productive cough:     Wheezing:         Neurologic    Sudden weakness in arms or legs:     Sudden numbness in arms or legs:     Sudden  onset of difficulty speaking or slurred speech:    Temporary loss of vision in one eye:     Problems with dizziness:  Gastrointestinal    Blood in stool:     Vomited blood:         Genitourinary    Burning when urinating:     Blood in urine:        Psychiatric    Major depression:         Hematologic    Bleeding problems:    Problems with blood clotting too easily:        Skin    Rashes or ulcers:        Constitutional    Fever or chills:      PHYSICAL EXAMINATION:  Vitals:   03/11/23 0925 03/11/23 0926  BP: (!) 116/55 120/62  Pulse: 62   Temp: 97.7 F (36.5 C)   TempSrc: Temporal   SpO2: 98%   Weight: 128 lb (58.1 kg)     General:  WDWN in NAD; vital signs documented above Gait: Normal HENT: WNL, normocephalic Pulmonary: normal non-labored breathing , without wheezing Cardiac: regular HR Abdomen: soft, NT, no masses Vascular Exam/Pulses: 2+ radial pulses bilaterally, 2+ DP pulses bilaterally Extremities: Moving extremities without any deficits Musculoskeletal: no muscle wasting or atrophy  Neurologic: A&O X 3 Psychiatric:  The pt has Normal affect.   Non-Invasive Vascular Imaging:   VAS Carotid Duplex: Summary:  Right Carotid: Velocities in the right ICA are consistent with a 1-39% stenosis.   Left Carotid: Velocities in the left ICA are consistent with a 1-39% stenosis.   Vertebrals: Bilateral vertebral arteries demonstrate antegrade flow.  Subclavians: Normal flow hemodynamics were seen in bilateral subclavian arteries   ASSESSMENT/PLAN:: 87 y.o. female here for follow up for carotid artery stenosis. She has remote history of bilateral endarterectomies, right 10/09/12 and left 11/27/12 by Dr. Myra Gianotti These were both performed for asymptomatic high grade stenosis. She has no new neurological symptoms. Her duplex today shows 1-39% stenosis bilaterally. Normal flow in vertebral and subclavian arteries.  - Continue Aspirin and Statin - She will  follow up in 1 year with repeat carotid duplex   Graceann Congress, PA-C [facility] [phone]  Clinic MD:   Myra Gianotti

## 2023-06-28 ENCOUNTER — Ambulatory Visit: Payer: Medicare HMO | Admitting: Interventional Cardiology

## 2023-07-13 ENCOUNTER — Other Ambulatory Visit: Payer: Self-pay | Admitting: Interventional Cardiology

## 2023-09-11 ENCOUNTER — Ambulatory Visit: Payer: Medicare HMO | Admitting: Physician Assistant

## 2024-03-18 ENCOUNTER — Other Ambulatory Visit: Payer: Self-pay

## 2024-03-18 DIAGNOSIS — I6523 Occlusion and stenosis of bilateral carotid arteries: Secondary | ICD-10-CM

## 2024-03-24 ENCOUNTER — Ambulatory Visit: Payer: Medicare HMO | Attending: Vascular Surgery | Admitting: Physician Assistant

## 2024-03-24 ENCOUNTER — Encounter: Payer: Self-pay | Admitting: Physician Assistant

## 2024-03-24 ENCOUNTER — Ambulatory Visit (HOSPITAL_COMMUNITY)
Admission: RE | Admit: 2024-03-24 | Discharge: 2024-03-24 | Disposition: A | Discharge status: Home / Self Care | Payer: Medicare HMO | Source: Ambulatory Visit | Attending: Vascular Surgery | Admitting: Vascular Surgery

## 2024-03-24 VITALS — BP 152/71 | HR 70 | Temp 98.3°F | Wt 131.5 lb

## 2024-03-24 DIAGNOSIS — I6523 Occlusion and stenosis of bilateral carotid arteries: Secondary | ICD-10-CM

## 2024-03-24 NOTE — Progress Notes (Signed)
 History of Present Illness:  Patient is a 88 y.o. year old female who presents for evaluation and surveillance  of carotid stenosis.  She has a history of  bilateral carotid endarterectomies,right 10/09/12 and left 11/27/12 by Dr. Charlotte Cookey She continues to have weak but functional vocal cords with hoarseness, but this is unchanged from prior visits.  These were both performed for asymptomatic high grade stenosis.  The patient denies symptoms of TIA, amaurosis, aphasia, weakness on one side, or stroke.    She was last seen 03/11/23 and her duplex showed < 39% stenosis B ICA.    The pt is on a statin for cholesterol management.  The pt is on a daily aspirin .   Other AC:  none The pt is on amlodipine  for hypertension.   The pt is not diabetic.   Tobacco hx:  never     Past Medical History:  Diagnosis Date   Allergy    Anxiety    Arthritis    hands   CAD (coronary artery disease)    Carotid artery occlusion    Diverticulosis    GERD (gastroesophageal reflux disease)    Hiatal hernia    Hyperlipidemia    Hypertension    IBS (irritable bowel syndrome)    Renal artery stenosis (HCC)     Past Surgical History:  Procedure Laterality Date   ABDOMINAL HYSTERECTOMY     BREAST SURGERY     implants removed   ENDARTERECTOMY  10/09/2012   Procedure: ENDARTERECTOMY CAROTID;  Surgeon: Margherita Shell, MD;  Location: [facility] OR;  Service: Vascular;  Laterality: Left;   ENDARTERECTOMY  11/27/2012   Procedure: ENDARTERECTOMY CAROTID;  Surgeon: Margherita Shell, MD;  Location: [facility] OR;  Service: Vascular;  Laterality: Right;   EYE SURGERY     tear duct repair   bilateral   PATCH ANGIOPLASTY  10/09/2012   Procedure: PATCH ANGIOPLASTY;  Surgeon: Margherita Shell, MD;  Location: [facility] OR;  Service: Vascular;  Laterality: Left;   PATCH ANGIOPLASTY  11/27/2012   Procedure: PATCH ANGIOPLASTY;  Surgeon: Margherita Shell, MD;  Location: [facility] OR;  Service: Vascular;  Laterality: Right;  Using Vascu- Guard 1cm x 6cm  patch.   RECTAL PROLAPSE REPAIR     [facility]. Dr. Dorris Gaul   [facility]  March 2015     Social History Social History   Tobacco Use   Smoking status: Never    Passive exposure: Never   Smokeless tobacco: Never   Tobacco comments:    couple oz wine daily  Vaping Use   Vaping status: Never Used  Substance Use Topics   Alcohol use: No    Alcohol/week: 0.0 standard drinks of alcohol   Drug use: No    Family History Family History  Problem Relation Age of Onset   Hyperlipidemia Mother    Hypertension Mother    Diabetes Father    COPD Father    Cancer Sister    Heart attack Sister    Stroke Son     Allergies  Allergies  Allergen Reactions   Codeine Other (See Comments)    crazy   Ace Inhibitors     cough   Angiotensin Receptor Blockers Other (See Comments)     Current Outpatient Medications  Medication Sig Dispense Refill   acetaminophen  (TYLENOL ) 500 MG tablet Take 500 mg by mouth as needed for mild pain or headache.      ALPRAZolam  (XANAX ) 0.5 MG tablet Take one tablet  by mouth twice every day 180 tablet 1   amLODipine  (NORVASC ) 5 MG tablet Take 5 mg by mouth daily.     aspirin  81 MG tablet Take 81 mg by mouth daily.     atorvastatin  (LIPITOR) 40 MG tablet Take 1 tablet (40 mg total) by mouth daily. Pt needs to keep upcoming appt in Oct for future refills 90 tablet 0   Calcium  Carb-Cholecalciferol (CALCIUM  + D3) 600-200 MG-UNIT TABS Take 1 tablet by mouth daily.      dapagliflozin propanediol (FARXIGA) 10 MG TABS tablet Take 10 mg by mouth daily.     diphenhydrAMINE  (BENADRYL ) 25 mg capsule Take 25 mg by mouth every 6 (six) hours as needed. For allergies     escitalopram  (LEXAPRO ) 10 MG tablet Take 10 mg by mouth at bedtime.      Multiple Vitamins-Minerals (ICAPS PO) Take 1 tablet by mouth 2 (two) times daily.      tiZANidine  (ZANAFLEX ) 2 MG tablet Take 1 tablet by mouth every 8 hours as needed. 30 tablet 0   zolpidem  (AMBIEN ) 5 MG tablet TAKE ONE  TABLET BY MOUTH EVERY NIGHT AT BEDTIME AS NEEDED 90 tablet 0   omeprazole  (PRILOSEC) 20 MG capsule TAKE ONE CAPSULE BY MOUTH DAILY (Patient taking differently: Take by mouth as needed.) 30 capsule 3   No current facility-administered medications for this visit.    ROS:   General:  No weight loss, Fever, chills  HEENT: No recent headaches, no nasal bleeding, no visual changes, no sore throat  Neurologic: No dizziness, blackouts, seizures. No recent symptoms of stroke or mini- stroke. No recent episodes of slurred speech, or temporary blindness.  Cardiac: No recent episodes of chest pain/pressure, no shortness of breath at rest.  No shortness of breath with exertion.  Denies history of atrial fibrillation or irregular heartbeat  Vascular: No history of rest pain in feet.  No history of claudication.  No history of non-healing ulcer, No history of DVT   Pulmonary: No home oxygen, no productive cough, no hemoptysis,  No asthma or wheezing  Musculoskeletal:  [ ]  Arthritis, [ ]  Low back pain,  [ ]  Joint pain  Hematologic:No history of hypercoagulable state.  No history of easy bleeding.  No history of anemia  Gastrointestinal: No hematochezia or melena,  No gastroesophageal reflux, no trouble swallowing  Urinary: [ ]  chronic Kidney disease, [ ]  on HD - [ ]  MWF or [ ]  TTHS, [ ]  Burning with urination, [ ]  Frequent urination, [ ]  Difficulty urinating;   Skin: No rashes  Psychological: No history of anxiety,  No history of depression   Physical Examination  Vitals:   03/24/24 0830 03/24/24 0833  BP: (!) 152/65 (!) 152/71  Pulse: 70   Temp: 98.3 F (36.8 C)   TempSrc: Temporal   SpO2: 94%   Weight: 131 lb 8 oz (59.6 kg)     Body mass index is 21.88 kg/m.  General:  Alert and oriented, no acute distress HEENT: Normal, speech horseness Neck: No bruit or JVD Pulmonary: Clear to auscultation bilaterally Cardiac: Regular Rate and Rhythm without murmur Gastrointestinal: Soft,  non-tender, non-distended, no mass, no scars Skin: No rash Extremity Pulses:  2+ radial pulses bilaterally Musculoskeletal: No deformity or edema  Neurologic: Upper and lower extremity motor 5/5 and symmetric  DATA:  Right Carotid Findings:  +----------+--------+--------+--------+------------------+-----------------  ----+           PSV cm/sEDV cm/sStenosisPlaque DescriptionComments                +----------+--------+--------+--------+------------------+-----------------  ----+  CCA Prox  61      13                                                        +----------+--------+--------+--------+------------------+-----------------  ----+  CCA Distal40      11              heterogenous                              +----------+--------+--------+--------+------------------+-----------------  ----+  ICA Prox  53      14      1-39%   homogeneous                               +----------+--------+--------+--------+------------------+-----------------  ----+  ICA Mid   93      22                                                        +----------+--------+--------+--------+------------------+-----------------  ----+  ICA Distal117     27                                                        +----------+--------+--------+--------+------------------+-----------------  ----+  ECA      450     65      >50%                      Increased  velocities                                                       from prior (296  cm/s)  +----------+--------+--------+--------+------------------+-----------------  ----+   +----------+--------+-------+----------------+           PSV cm/sEDV cmsDescribe          +----------+--------+-------+----------------+  [id number]     0      Multiphasic, WNL  +----------+--------+-------+----------------+   +---------+--------+--+--------+--+---------+  VertebralPSV cm/s89EDV  cm/s13Antegrade  +---------+--------+--+--------+--+---------+      Left Carotid Findings:  +----------+--------+--------+--------+------------------+           PSV cm/sEDV cm/sStenosisPlaque Description  +----------+--------+--------+--------+------------------+  CCA Prox  85      17                                  +----------+--------+--------+--------+------------------+  CCA Distal87      18              heterogenous        +----------+--------+--------+--------+------------------+  ICA Prox  77      20              homogeneous         +----------+--------+--------+--------+------------------+  ICA Mid   85      26                                  +----------+--------+--------+--------+------------------+  ICA Distal84      23                                  +----------+--------+--------+--------+------------------+  ECA      90      12                                  +----------+--------+--------+--------+------------------+   +----------+--------+--------+----------------+           PSV cm/sEDV cm/sDescribe          +----------+--------+--------+----------------+  Subclavian136    0       Multiphasic, WNL  +----------+--------+--------+----------------+   +---------+--------+--+--------+---------+  VertebralPSV cm/s94EDV cm/sAntegrade  +---------+--------+--+--------+---------+         Summary:  Right Carotid: Velocities in the right ICA are consistent with a 1-39%  stenosis.   Left Carotid: Velocities in the left ICA are consistent with a 1-39%  stenosis.   Vertebrals: Bilateral vertebral arteries demonstrate antegrade flow.  Subclavians: Normal flow hemodynamics were seen in bilateral subclavian               arteries.    ASSESSMENT/PLAN: S/P B ICA asymptomatic stenosis with B endarterectomies in the distant past. She remains asymptomatic for stroke/TIA.  She stays active with daily walking and  keeps a flower garden.  She is independent with ADLs.  Her duplex shows < 39% stenosis B ICAs.  She will f/u in 2 years for repeat carotid duplex surveillance.  If she has issues she will call our office.     Rocky Cipro PA-C [facility] [place] Office: [phone]  MD in clinic [place]
